# Patient Record
Sex: Male | Born: 1945
Health system: Southern US, Community
[De-identification: ages and names within clinical notes are randomized; demographics above are authoritative.]

## PROBLEM LIST (undated history)

## (undated) DIAGNOSIS — W3400XA Accidental discharge from unspecified firearms or gun, initial encounter: Secondary | ICD-10-CM

## (undated) DIAGNOSIS — I10 Essential (primary) hypertension: Secondary | ICD-10-CM

## (undated) DIAGNOSIS — E669 Obesity, unspecified: Secondary | ICD-10-CM

## (undated) DIAGNOSIS — H269 Unspecified cataract: Secondary | ICD-10-CM

## (undated) DIAGNOSIS — R011 Cardiac murmur, unspecified: Secondary | ICD-10-CM

## (undated) DIAGNOSIS — I350 Nonrheumatic aortic (valve) stenosis: Secondary | ICD-10-CM

## (undated) DIAGNOSIS — E119 Type 2 diabetes mellitus without complications: Secondary | ICD-10-CM

## (undated) DIAGNOSIS — E7800 Pure hypercholesterolemia, unspecified: Secondary | ICD-10-CM

## (undated) HISTORY — DX: Nonrheumatic aortic (valve) stenosis: I35.0

## (undated) HISTORY — DX: Accidental discharge from unspecified firearms or gun, initial encounter: W34.00XA

## (undated) HISTORY — DX: Type 2 diabetes mellitus without complications: E11.9

## (undated) HISTORY — PX: OTHER SURGICAL HISTORY: SHX169

## (undated) HISTORY — DX: Unspecified cataract: H26.9

## (undated) HISTORY — PX: WISDOM TOOTH EXTRACTION: SHX21

---

## 1968-07-29 DIAGNOSIS — W3400XA Accidental discharge from unspecified firearms or gun, initial encounter: Secondary | ICD-10-CM

## 1968-07-29 HISTORY — DX: Accidental discharge from unspecified firearms or gun, initial encounter: W34.00XA

## 2011-11-14 DIAGNOSIS — I350 Nonrheumatic aortic (valve) stenosis: Secondary | ICD-10-CM

## 2011-11-14 HISTORY — DX: Nonrheumatic aortic (valve) stenosis: I35.0

## 2012-10-28 ENCOUNTER — Emergency Department (HOSPITAL_COMMUNITY)
Admission: EM | Admit: 2012-10-28 | Discharge: 2012-10-28 | Disposition: A | Discharge status: Home / Self Care | Payer: Medicare Other | Attending: Emergency Medicine | Admitting: Emergency Medicine

## 2012-10-28 ENCOUNTER — Encounter (HOSPITAL_COMMUNITY): Payer: Self-pay | Admitting: Emergency Medicine

## 2012-10-28 DIAGNOSIS — I10 Essential (primary) hypertension: Secondary | ICD-10-CM | POA: Insufficient documentation

## 2012-10-28 DIAGNOSIS — J3489 Other specified disorders of nose and nasal sinuses: Secondary | ICD-10-CM | POA: Insufficient documentation

## 2012-10-28 DIAGNOSIS — Z79899 Other long term (current) drug therapy: Secondary | ICD-10-CM | POA: Insufficient documentation

## 2012-10-28 DIAGNOSIS — Z7982 Long term (current) use of aspirin: Secondary | ICD-10-CM | POA: Insufficient documentation

## 2012-10-28 DIAGNOSIS — R040 Epistaxis: Secondary | ICD-10-CM | POA: Insufficient documentation

## 2012-10-28 DIAGNOSIS — R6889 Other general symptoms and signs: Secondary | ICD-10-CM | POA: Insufficient documentation

## 2012-10-28 DIAGNOSIS — Z862 Personal history of diseases of the blood and blood-forming organs and certain disorders involving the immune mechanism: Secondary | ICD-10-CM | POA: Insufficient documentation

## 2012-10-28 DIAGNOSIS — R011 Cardiac murmur, unspecified: Secondary | ICD-10-CM | POA: Insufficient documentation

## 2012-10-28 DIAGNOSIS — Z8639 Personal history of other endocrine, nutritional and metabolic disease: Secondary | ICD-10-CM | POA: Insufficient documentation

## 2012-10-28 HISTORY — DX: Pure hypercholesterolemia, unspecified: E78.00

## 2012-10-28 HISTORY — DX: Cardiac murmur, unspecified: R01.1

## 2012-10-28 HISTORY — DX: Essential (primary) hypertension: I10

## 2012-10-28 NOTE — ED Provider Notes (Signed)
History     CSN: [id number]  Arrival date & time 10/28/12  1932   None     Chief Complaint  Patient presents with  . Epistaxis    (Consider location/radiation/quality/duration/timing/severity/associated sxs/prior treatment) HPI History provided by pt.   Pt had bleeding from right nostril this afternoon while working out in garden, and lasted for approximately 20 minutes.  Bleed heavily.  Stopped after holding pressure.  Has had nasal congestion, sinus pressure and sneezing today, but denies nose pain and is otherwise feeling well. Has not had lightheadedness, SOB, fatigue or generalized weakness.   No trauma.  Is not anti-coagulated.   Past Medical History  Diagnosis Date  . Hypertension   . Heart murmur   . Hypercholesteremia     History reviewed. No pertinent past surgical history.  No family history on file.  History  Substance Use Topics  . Smoking status: Never Smoker   . Smokeless tobacco: Not on file  . Alcohol Use: No      Review of Systems  All other systems reviewed and are negative.    Allergies  Review of patient's allergies indicates no known allergies.  Home Medications   Current Outpatient Rx  Name  Route  Sig  Dispense  Refill  . amLODipine (NORVASC) 10 MG tablet   Oral   Take 10 mg by mouth daily.         Marland Kitchen aspirin 81 MG chewable tablet   Oral   Chew 81 mg by mouth daily.         . metoprolol tartrate (LOPRESSOR) 25 MG tablet   Oral   Take 25 mg by mouth 2 (two) times daily.         . Multiple Vitamin (MULTIVITAMIN WITH MINERALS) TABS   Oral   Take 1 tablet by mouth daily.         . potassium chloride (K-DUR,KLOR-CON) 10 MEQ tablet   Oral   Take 10 mEq by mouth daily.         Marland Kitchen triamterene-hydrochlorothiazide (MAXZIDE) 75-50 MG per tablet   Oral   Take 1 tablet by mouth daily.           BP 147/106  Pulse 64  Temp(Src) 98.1 F (36.7 C) (Oral)  Resp 14  SpO2 97%  Physical Exam  Nursing note and vitals  reviewed. Constitutional: He is oriented to person, place, and time. He appears well-developed and well-nourished. No distress.  HENT:  Head: Normocephalic and atraumatic.  Dried blood at kiesselbach's plexus of right nostril.  No active epistaxis.  L nostril normal.  No sinus tenderness.  Nml posterior pharynx.   Eyes:  Normal appearance  Neck: Normal range of motion.  Cardiovascular: Normal rate and regular rhythm.   Murmur heard. Pulmonary/Chest: Effort normal and breath sounds normal. No respiratory distress.  Musculoskeletal: Normal range of motion.  Neurological: He is alert and oriented to person, place, and time.  Skin: Skin is warm and dry. No rash noted.  Psychiatric: He has a normal mood and affect. His behavior is normal.    ED Course  Procedures (including critical care time)  Labs Reviewed - No data to display No results found.   1. Epistaxis       67yo M presents w/ c/o epistaxis, currently resolved.  No trauma.  Is not anti-coagulated.  It appears that bleeding originated at R kiesselbach's plexus.  VS w/in nml range and pt has not had any symptoms to suggest anemia.  Explained how to treat future episodes and recommended PCP f/u for recurrent sx.  Return precautions discussed.         Arie Sabina Shashwat Cleary, PA-C 10/28/12 2128

## 2012-10-28 NOTE — ED Notes (Signed)
PT. REPORTS EPISTAXIS THIS EVENING , DENIES INJURY , NO BLEEDING AT TRIAGE , STATES TAKING ASA DAILY.

## 2012-10-29 NOTE — ED Provider Notes (Signed)
Medical screening examination/treatment/procedure(s) were performed by non-physician practitioner and as supervising physician I was immediately available for consultation/collaboration.  Tyrone Pautsch T Arlis Yale, MD 10/29/12 1146 

## 2012-11-03 ENCOUNTER — Encounter: Payer: Self-pay | Admitting: *Deleted

## 2012-11-04 ENCOUNTER — Encounter: Payer: Self-pay | Admitting: Cardiovascular Disease

## 2013-01-11 ENCOUNTER — Emergency Department (HOSPITAL_COMMUNITY): Payer: Medicare Other

## 2013-01-11 ENCOUNTER — Encounter (HOSPITAL_COMMUNITY): Payer: Self-pay | Admitting: Emergency Medicine

## 2013-01-11 ENCOUNTER — Emergency Department (HOSPITAL_COMMUNITY)
Admission: EM | Admit: 2013-01-11 | Discharge: 2013-01-11 | Disposition: A | Discharge status: Home / Self Care | Payer: Medicare Other | Attending: Emergency Medicine | Admitting: Emergency Medicine

## 2013-01-11 DIAGNOSIS — M79604 Pain in right leg: Secondary | ICD-10-CM

## 2013-01-11 DIAGNOSIS — Z79899 Other long term (current) drug therapy: Secondary | ICD-10-CM | POA: Insufficient documentation

## 2013-01-11 DIAGNOSIS — Z8679 Personal history of other diseases of the circulatory system: Secondary | ICD-10-CM | POA: Insufficient documentation

## 2013-01-11 DIAGNOSIS — M5430 Sciatica, unspecified side: Secondary | ICD-10-CM | POA: Insufficient documentation

## 2013-01-11 DIAGNOSIS — Z7982 Long term (current) use of aspirin: Secondary | ICD-10-CM | POA: Insufficient documentation

## 2013-01-11 DIAGNOSIS — E7800 Pure hypercholesterolemia, unspecified: Secondary | ICD-10-CM | POA: Insufficient documentation

## 2013-01-11 DIAGNOSIS — I10 Essential (primary) hypertension: Secondary | ICD-10-CM | POA: Insufficient documentation

## 2013-01-11 DIAGNOSIS — M79609 Pain in unspecified limb: Secondary | ICD-10-CM | POA: Insufficient documentation

## 2013-01-11 DIAGNOSIS — Z880 Allergy status to penicillin: Secondary | ICD-10-CM | POA: Insufficient documentation

## 2013-01-11 DIAGNOSIS — Z87828 Personal history of other (healed) physical injury and trauma: Secondary | ICD-10-CM | POA: Insufficient documentation

## 2013-01-11 DIAGNOSIS — Z87891 Personal history of nicotine dependence: Secondary | ICD-10-CM | POA: Insufficient documentation

## 2013-01-11 DIAGNOSIS — M5431 Sciatica, right side: Secondary | ICD-10-CM

## 2013-01-11 MED ORDER — PREDNISONE 20 MG PO TABS: 40.0000 mg | ORAL_TABLET | Freq: Every day | ORAL | Status: DC

## 2013-01-11 MED ORDER — HYDROCODONE-ACETAMINOPHEN 5-325 MG PO TABS: 1.0000 | ORAL_TABLET | ORAL | Status: DC | PRN
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
## 2013-01-14 NOTE — ED Provider Notes (Signed)
Medical screening examination/treatment/procedure(s) were performed by non-physician practitioner and as supervising physician I was immediately available for consultation/collaboration.   Mitch Arquette, MD 01/14/13 0711 

## 2013-02-04 ENCOUNTER — Encounter: Payer: Self-pay | Admitting: Gastroenterology

## 2013-02-18 ENCOUNTER — Encounter: Payer: Self-pay | Admitting: Gastroenterology

## 2013-02-18 ENCOUNTER — Ambulatory Visit (INDEPENDENT_AMBULATORY_CARE_PROVIDER_SITE_OTHER): Payer: Medicare Other | Admitting: Gastroenterology

## 2013-02-18 VITALS — BP 140/70 | HR 64 | Ht 66.75 in | Wt 249.2 lb

## 2013-02-18 DIAGNOSIS — D509 Iron deficiency anemia, unspecified: Secondary | ICD-10-CM

## 2013-02-18 DIAGNOSIS — R195 Other fecal abnormalities: Secondary | ICD-10-CM | POA: Insufficient documentation

## 2013-02-18 NOTE — Assessment & Plan Note (Addendum)
Patient has a microcytic anemia and Hemoccult-positive stool. GI blood loss should be ruled out including polyps, AVMs, neoplasm, hemorrhoids and active peptic ulcer disease. Note that the patient has a history of Pica. This could be contributing to his iron deficiency anemia although I would not expect it to cause a Hemoccult-positive stool.  Recommendations #1 colonoscopy; if negative proceed with upper endoscopy
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
## 2013-02-18 NOTE — Patient Instructions (Addendum)

## 2013-02-18 NOTE — Assessment & Plan Note (Signed)
See comments under iron deficiency anemia

## 2013-03-19 ENCOUNTER — Other Ambulatory Visit: Payer: Self-pay | Admitting: Cardiovascular Disease

## 2013-03-31 ENCOUNTER — Encounter: Payer: Self-pay | Admitting: Gastroenterology

## 2013-03-31 ENCOUNTER — Ambulatory Visit (AMBULATORY_SURGERY_CENTER): Payer: Medicare Other | Admitting: Gastroenterology

## 2013-03-31 VITALS — BP 113/66 | HR 50 | Temp 97.4°F | Resp 18 | Ht 66.0 in | Wt 249.0 lb

## 2013-03-31 DIAGNOSIS — D126 Benign neoplasm of colon, unspecified: Secondary | ICD-10-CM

## 2013-03-31 DIAGNOSIS — K621 Rectal polyp: Secondary | ICD-10-CM

## 2013-03-31 DIAGNOSIS — K620 Anal polyp: Secondary | ICD-10-CM

## 2013-03-31 DIAGNOSIS — D509 Iron deficiency anemia, unspecified: Secondary | ICD-10-CM

## 2013-03-31 LAB — GLUCOSE, CAPILLARY
Glucose-Capillary: 101 mg/dL — ABNORMAL HIGH (ref 70–99)
Glucose-Capillary: 114 mg/dL — ABNORMAL HIGH (ref 70–99)

## 2013-03-31 MED ORDER — SODIUM CHLORIDE 0.9 % IV SOLN: 500.0000 mL | INTRAVENOUS | Status: DC

## 2013-03-31 NOTE — Progress Notes (Signed)
Called to room to assist during endoscopic procedure.  Patient ID and intended procedure confirmed with present staff. Received instructions for my participation in the procedure from the performing physician.  

## 2013-03-31 NOTE — Progress Notes (Signed)
Report to pacu rn, vss, bbs=clear 

## 2013-03-31 NOTE — Op Note (Addendum)
[facility] [address] [place], [address]   COLONOSCOPY PROCEDURE REPORT  PATIENT: Bernard Garcia, Bernard Garcia.  MR#: [id number] BIRTHDATE: 26-Sep-1945 , 66  yrs. old GENDER: Male ENDOSCOPIST: Louis Meckel, MD REFERRED BY: PROCEDURE DATE:  03/31/2013 PROCEDURE:   Colonoscopy with snare polypectomy  ASA CLASS:   Class II INDICATIONS:Iron Deficiency Anemia. MEDICATIONS: MAC sedation, administered by CRNA and Propofol (Diprivan) 340 mg IV  DESCRIPTION OF PROCEDURE:   After the risks benefits and alternatives of the procedure were thoroughly explained, informed consent was obtained.  A digital rectal exam revealed no abnormalities of the rectum.   The LB [id number]  endoscope was introduced through the anus and advanced to the cecum, which was identified by both the appendix and ileocecal valve. No adverse events experienced.   The quality of the prep was excellent using Suprep  The instrument was then slowly withdrawn as the colon was fully examined.      COLON FINDINGS: A sessile polyp measuring 4 mm in size was found in the rectum.  A polypectomy was performed with a cold snare and with cold forceps.  The resection was complete and the polyp tissue was completely retrieved.  Retroflexed views revealed no abnormalities. The time to cecum=3 minutes 26 seconds.  Withdrawal time=11 minutes 09 seconds.  The scope was withdrawn and the procedure completed. COMPLICATIONS: There were no complications.  ENDOSCOPIC IMPRESSION: Sessile polyp measuring 4 mm in size was found in the rectum; polypectomy was performed with a cold snare and with cold forceps  Source for Fe deficiency anemia and heme positive stool not determined  RECOMMENDATIONS: 1.  Upper endoscopy will be scheduled 2.  If the polyp(s) removed today are proven to be adenomatous (pre-cancerous) polyps, you will need a repeat colonoscopy in 5 years.  Otherwise you should continue to follow  colorectal cancer screening guidelines for "routine risk" patients with colonoscopy in 10 years.  You will receive a letter within 1-2 weeks with the results of your biopsy as well as final recommendations.  Please call my office if you have not received a letter after 3 weeks.   eSigned:  Louis Meckel, MD 03/31/2013 2:17 PM   cc:   PATIENT NAME:  Bernard Garcia, Bernard Garcia. MR#: [id number]

## 2013-03-31 NOTE — Patient Instructions (Addendum)

## 2013-03-31 NOTE — Progress Notes (Signed)
Patient did not experience any of the following events: a burn prior to discharge; a fall within the facility; wrong site/side/patient/procedure/implant event; or a hospital transfer or hospital admission upon discharge from the facility. (G8907) Patient did not have preoperative order for IV antibiotic SSI prophylaxis. (G8918)  

## 2013-04-01 ENCOUNTER — Telehealth: Payer: Self-pay | Admitting: *Deleted

## 2013-04-01 NOTE — Telephone Encounter (Signed)
No answer, message left for the patient. 

## 2013-04-05 ENCOUNTER — Other Ambulatory Visit: Payer: Self-pay | Admitting: Cardiovascular Disease

## 2013-04-05 LAB — LIPID PANEL
Cholesterol: 95 mg/dL (ref 0–200)
HDL: 32 mg/dL — ABNORMAL LOW (ref >39)
Total CHOL/HDL Ratio: 3 Ratio

## 2013-04-05 LAB — HEPATIC FUNCTION PANEL
ALT: 11 U/L (ref 0–53)
AST: 13 U/L (ref 0–37)
Albumin: 4.3 g/dL (ref 3.5–5.2)
Alkaline Phosphatase: 83 U/L (ref 39–117)
Bilirubin, Direct: 0.1 mg/dL (ref 0.0–0.3)
Total Bilirubin: 0.4 mg/dL (ref 0.3–1.2)

## 2013-04-07 ENCOUNTER — Encounter: Payer: Self-pay | Admitting: *Deleted

## 2013-04-07 ENCOUNTER — Encounter: Payer: Self-pay | Admitting: Gastroenterology

## 2013-04-08 ENCOUNTER — Encounter: Payer: Self-pay | Admitting: Gastroenterology

## 2013-04-09 ENCOUNTER — Telehealth: Payer: Self-pay | Admitting: *Deleted

## 2013-04-09 NOTE — Telephone Encounter (Signed)
No to pre-visit appointment on 04/09/13 at 1pm, number has been disconnected.

## 2013-04-14 NOTE — Telephone Encounter (Signed)
Pt had pre-visit appt on 04/09/13, called pt left message pt would need to call and reschedule appt with pre-visit and schedule new date for procedure, both would be cancelled at this time.-adm

## 2013-04-15 ENCOUNTER — Encounter: Payer: Medicare Other | Admitting: Gastroenterology

## 2013-09-08 ENCOUNTER — Emergency Department (HOSPITAL_COMMUNITY)
Admission: EM | Admit: 2013-09-08 | Discharge: 2013-09-08 | Disposition: A | Discharge status: Home / Self Care | Payer: Medicare PPO | Attending: Emergency Medicine | Admitting: Emergency Medicine

## 2013-09-08 ENCOUNTER — Encounter (HOSPITAL_COMMUNITY): Payer: Self-pay | Admitting: Emergency Medicine

## 2013-09-08 DIAGNOSIS — D509 Iron deficiency anemia, unspecified: Secondary | ICD-10-CM | POA: Insufficient documentation

## 2013-09-08 DIAGNOSIS — R011 Cardiac murmur, unspecified: Secondary | ICD-10-CM | POA: Insufficient documentation

## 2013-09-08 DIAGNOSIS — R040 Epistaxis: Secondary | ICD-10-CM

## 2013-09-08 DIAGNOSIS — E669 Obesity, unspecified: Secondary | ICD-10-CM | POA: Insufficient documentation

## 2013-09-08 DIAGNOSIS — I10 Essential (primary) hypertension: Secondary | ICD-10-CM | POA: Insufficient documentation

## 2013-09-08 DIAGNOSIS — Z880 Allergy status to penicillin: Secondary | ICD-10-CM | POA: Insufficient documentation

## 2013-09-08 DIAGNOSIS — E7800 Pure hypercholesterolemia, unspecified: Secondary | ICD-10-CM | POA: Insufficient documentation

## 2013-09-08 DIAGNOSIS — Z87828 Personal history of other (healed) physical injury and trauma: Secondary | ICD-10-CM | POA: Insufficient documentation

## 2013-09-08 DIAGNOSIS — Z7982 Long term (current) use of aspirin: Secondary | ICD-10-CM | POA: Insufficient documentation

## 2013-09-08 DIAGNOSIS — E119 Type 2 diabetes mellitus without complications: Secondary | ICD-10-CM | POA: Insufficient documentation

## 2013-09-08 DIAGNOSIS — Z79899 Other long term (current) drug therapy: Secondary | ICD-10-CM | POA: Insufficient documentation

## 2013-09-08 DIAGNOSIS — E785 Hyperlipidemia, unspecified: Secondary | ICD-10-CM | POA: Insufficient documentation

## 2013-09-08 DIAGNOSIS — Z87891 Personal history of nicotine dependence: Secondary | ICD-10-CM | POA: Insufficient documentation

## 2013-09-08 HISTORY — DX: Obesity, unspecified: E66.9

## 2013-09-08 LAB — CBC
HCT: 32 % — ABNORMAL LOW (ref 39.0–52.0)
HEMOGLOBIN: 9.8 g/dL — AB (ref 13.0–17.0)
MCH: 21.9 pg — AB (ref 26.0–34.0)
MCHC: 30.6 g/dL (ref 30.0–36.0)
MCV: 71.6 fL — ABNORMAL LOW (ref 78.0–100.0)
Platelets: 321 10*3/uL (ref 150–400)
RBC: 4.47 MIL/uL (ref 4.22–5.81)
RDW: 17.2 % — ABNORMAL HIGH (ref 11.5–15.5)
WBC: 8.3 10*3/uL (ref 4.0–10.5)

## 2013-09-08 MED ORDER — OXYMETAZOLINE HCL 0.05 % NA SOLN: NASAL | Status: DC

## 2013-09-08 NOTE — ED Notes (Signed)
Pt reports bleeding to right nare that lasted less than an hour. Denies any dizziness/lightheadedness. Pt ambulates independently. Bleeding stopped. Nare with dried blood. Denies pain.

## 2013-09-08 NOTE — Discharge Instructions (Signed)
1. Medications: afrin, usual home medications 2. Treatment: rest, drink plenty of fluids,  3. Follow Up: Please followup with your primary doctor for discussion of your diagnoses and further evaluation after today's visit;     Nosebleed Nosebleeds can be caused by many conditions including trauma, infections, polyps, foreign bodies, dry mucous membranes or climate, medications and air conditioning. Most nosebleeds occur in the front of the nose. It is because of this location that most nosebleeds can be controlled by pinching the nostrils gently and continuously. Do this for at least 10 to 20 minutes. The reason for this long continuous pressure is that you must hold it long enough for the blood to clot. If during that 10 to 20 minute time period, pressure is released, the process may have to be started again. The nosebleed may stop by itself, quit with pressure, need concentrated heating (cautery) or stop with pressure from packing. HOME CARE INSTRUCTIONS   If your nose was packed, try to maintain the pack inside until your caregiver removes it. If a gauze pack was used and it starts to fall out, gently replace or cut the end off. Do not cut if a balloon catheter was used to pack the nose. Otherwise, do not remove unless instructed.  Avoid blowing your nose for 12 hours after treatment. This could dislodge the pack or clot and start bleeding again.  If the bleeding starts again, sit up and bending forward, gently pinch the front half of your nose continuously for 20 minutes.  If bleeding was caused by dry mucous membranes, cover the inside of your nose every morning with a petroleum or antibiotic ointment. Use your little fingertip as an applicator. Do this as needed during dry weather. This will keep the mucous membranes moist and allow them to heal.  Maintain humidity in your home by using less air conditioning or using a humidifier.  Do not use aspirin or medications which make bleeding more  likely. Your caregiver can give you recommendations on this.  Resume normal activities as able but try to avoid straining, lifting or bending at the waist for several days.  If the nosebleeds become recurrent and the cause is unknown, your caregiver may suggest laboratory tests. SEEK IMMEDIATE MEDICAL CARE IF:   Bleeding recurs and cannot be controlled.  There is unusual bleeding from or bruising on other parts of the body.  You have a fever.  Nosebleeds continue.  There is any worsening of the condition which originally brought you in.  You become lightheaded, feel faint, become sweaty or vomit blood. MAKE SURE YOU:   Understand these instructions.  Will watch your condition.  Will get help right away if you are not doing well or get worse. Document Released: 04/24/2005 Document Revised: 10/07/2011 Document Reviewed: 06/16/2009 [facility] Patient Information 2014 [place], [place].
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
## 2013-09-08 NOTE — ED Notes (Signed)
Pt. reports epistaxis at right nare onset this evening , denies injury , pt. is not taking anticoagulant or history of coagulopathy.

## 2013-09-09 NOTE — ED Provider Notes (Signed)
Medical screening examination/treatment/procedure(s) were performed by non-physician practitioner and as supervising physician I was immediately available for consultation/collaboration.  EKG Interpretation   None         Mervin Kung, MD 09/09/13 [phone]

## 2013-09-10 ENCOUNTER — Other Ambulatory Visit: Payer: Self-pay | Admitting: Cardiovascular Disease

## 2013-10-27 ENCOUNTER — Ambulatory Visit: Payer: Medicare Other | Admitting: Cardiovascular Disease

## 2013-11-30 ENCOUNTER — Ambulatory Visit (INDEPENDENT_AMBULATORY_CARE_PROVIDER_SITE_OTHER): Payer: Commercial Managed Care - HMO | Admitting: Cardiovascular Disease

## 2013-11-30 VITALS — BP 162/72 | HR 48 | Resp 16 | Ht 67.0 in | Wt 250.7 lb

## 2013-11-30 DIAGNOSIS — E7800 Pure hypercholesterolemia, unspecified: Secondary | ICD-10-CM

## 2013-11-30 DIAGNOSIS — I350 Nonrheumatic aortic (valve) stenosis: Secondary | ICD-10-CM

## 2013-11-30 DIAGNOSIS — I359 Nonrheumatic aortic valve disorder, unspecified: Secondary | ICD-10-CM

## 2013-11-30 DIAGNOSIS — R002 Palpitations: Secondary | ICD-10-CM

## 2013-11-30 DIAGNOSIS — I10 Essential (primary) hypertension: Secondary | ICD-10-CM

## 2013-11-30 MED ORDER — CARVEDILOL 6.25 MG PO TABS: 6.2500 mg | ORAL_TABLET | Freq: Two times a day (BID) | ORAL | Status: DC

## 2013-11-30 NOTE — Patient Instructions (Signed)
Your physician recommends that you schedule a follow-up appointment in: One Year with Dr. Sallyanne Kuster  STOP Metoprolol START Coreg 6.25mg . TWICE A DAY

## 2013-12-01 ENCOUNTER — Encounter: Payer: Self-pay | Admitting: Cardiovascular Disease

## 2013-12-01 DIAGNOSIS — I350 Nonrheumatic aortic (valve) stenosis: Secondary | ICD-10-CM | POA: Insufficient documentation

## 2013-12-01 DIAGNOSIS — I10 Essential (primary) hypertension: Secondary | ICD-10-CM | POA: Insufficient documentation

## 2013-12-01 DIAGNOSIS — E7800 Pure hypercholesterolemia, unspecified: Secondary | ICD-10-CM | POA: Insufficient documentation

## 2013-12-01 NOTE — Assessment & Plan Note (Signed)
Excellent reduction in LDL cholesterol. HDL would improve if he would lose substantial weight.

## 2013-12-01 NOTE — Assessment & Plan Note (Signed)
Marked and symptomatic sinus bradycardia.  I think we need to reduce the beta blocker effect. Have suggested that we switch from metoprolol to carvedilol which should provide for a better reduction in blood pressure without as much bradycardia.

## 2013-12-01 NOTE — Assessment & Plan Note (Signed)
Asymptomatic. No signs of critical stenosis by physical exam. Repeat echo if he develops symptoms.
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
## 2013-12-17 ENCOUNTER — Telehealth: Payer: Self-pay | Admitting: Cardiovascular Disease

## 2013-12-17 ENCOUNTER — Telehealth: Payer: Self-pay | Admitting: *Deleted

## 2013-12-17 MED ORDER — CARVEDILOL 6.25 MG PO TABS: 3.1250 mg | ORAL_TABLET | Freq: Two times a day (BID) | ORAL | Status: DC

## 2013-12-17 NOTE — Telephone Encounter (Signed)
Left message on home and cell #'s with instructions to decrease carvedilol to 3.125mg  bid keep taking BP's and keep a log and send to [place] after making med changes.

## 2013-12-17 NOTE — Telephone Encounter (Signed)
Received his blood pressure and heart rate recordings. His heart rate is still rather slow. Reduce carvedilol to 3.125 mg twice a day. Please send [place] another week of recordings starting several days after he has made a change.

## 2013-12-17 NOTE — Telephone Encounter (Signed)
Message copied by Tressa Busman on Fri Dec 17, 2013  5:38 PM ------      Message from: Sanda Klein      Created: Fri Dec 17, 2013  2:44 PM       Please ask them to reduce carvedilol to 3.125 mg twice a day and send [place] another week of blood pressure recordings several days after he has made a change. I made a note in the computer but not changed the medicine order. ------

## 2013-12-21 ENCOUNTER — Telehealth: Payer: Self-pay | Admitting: *Deleted

## 2013-12-21 NOTE — Telephone Encounter (Signed)
Patient did receive the message last week and reduced his Carvedilol to 3.125mg  bid. States he was having some lightheadedness on the higher dose and this has resolved.  Will continue to monitor his BP's and send to Dr. Loletha Grayer after one week

## 2013-12-21 NOTE — Telephone Encounter (Signed)
Message copied by Tressa Busman on Tue Dec 21, 2013  5:26 PM ------      Message from: Sanda Klein      Created: Fri Dec 17, 2013  2:44 PM       Please ask them to reduce carvedilol to 3.125 mg twice a day and send [place] another week of blood pressure recordings several days after he has made a change. I made a note in the computer but not changed the medicine order. ------

## 2013-12-22 ENCOUNTER — Other Ambulatory Visit: Payer: Self-pay | Admitting: *Deleted

## 2014-09-08 DIAGNOSIS — E785 Hyperlipidemia, unspecified: Secondary | ICD-10-CM | POA: Diagnosis not present

## 2014-09-08 DIAGNOSIS — E669 Obesity, unspecified: Secondary | ICD-10-CM | POA: Diagnosis not present

## 2014-09-08 DIAGNOSIS — Z6841 Body Mass Index (BMI) 40.0 and over, adult: Secondary | ICD-10-CM | POA: Diagnosis not present

## 2014-09-08 DIAGNOSIS — I10 Essential (primary) hypertension: Secondary | ICD-10-CM | POA: Diagnosis not present

## 2014-10-18 DIAGNOSIS — R972 Elevated prostate specific antigen [PSA]: Secondary | ICD-10-CM | POA: Diagnosis not present

## 2014-12-02 ENCOUNTER — Ambulatory Visit (INDEPENDENT_AMBULATORY_CARE_PROVIDER_SITE_OTHER): Payer: Commercial Managed Care - HMO | Admitting: Cardiovascular Disease

## 2014-12-02 ENCOUNTER — Encounter: Payer: Self-pay | Admitting: Cardiovascular Disease

## 2014-12-02 VITALS — BP 140/69 | HR 64 | Resp 16 | Ht 67.0 in | Wt 257.4 lb

## 2014-12-02 DIAGNOSIS — I10 Essential (primary) hypertension: Secondary | ICD-10-CM

## 2014-12-02 DIAGNOSIS — E7800 Pure hypercholesterolemia, unspecified: Secondary | ICD-10-CM

## 2014-12-02 DIAGNOSIS — I350 Nonrheumatic aortic (valve) stenosis: Secondary | ICD-10-CM

## 2014-12-02 NOTE — Patient Instructions (Signed)
Dr.Croitoru  wants you to follow-up in: ONE YEAR. You will receive a reminder letter in the mail two months in advance. If you don't receive a letter, please call our office to schedule the follow-up appointment.
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
## 2014-12-08 ENCOUNTER — Encounter: Payer: Self-pay | Admitting: Cardiovascular Disease

## 2014-12-08 DIAGNOSIS — I10 Essential (primary) hypertension: Secondary | ICD-10-CM | POA: Diagnosis not present

## 2014-12-08 DIAGNOSIS — Z6841 Body Mass Index (BMI) 40.0 and over, adult: Secondary | ICD-10-CM | POA: Diagnosis not present

## 2014-12-08 DIAGNOSIS — E669 Obesity, unspecified: Secondary | ICD-10-CM | POA: Diagnosis not present

## 2015-04-03 ENCOUNTER — Other Ambulatory Visit: Payer: Self-pay | Admitting: Cardiovascular Disease

## 2015-05-26 DIAGNOSIS — I10 Essential (primary) hypertension: Secondary | ICD-10-CM | POA: Diagnosis not present

## 2015-05-26 DIAGNOSIS — Z125 Encounter for screening for malignant neoplasm of prostate: Secondary | ICD-10-CM | POA: Diagnosis not present

## 2015-05-26 DIAGNOSIS — E785 Hyperlipidemia, unspecified: Secondary | ICD-10-CM | POA: Diagnosis not present

## 2015-05-26 DIAGNOSIS — R739 Hyperglycemia, unspecified: Secondary | ICD-10-CM | POA: Diagnosis not present

## 2015-06-02 DIAGNOSIS — R0989 Other specified symptoms and signs involving the circulatory and respiratory systems: Secondary | ICD-10-CM | POA: Diagnosis not present

## 2015-06-02 DIAGNOSIS — I10 Essential (primary) hypertension: Secondary | ICD-10-CM | POA: Diagnosis not present

## 2015-06-02 DIAGNOSIS — Z1212 Encounter for screening for malignant neoplasm of rectum: Secondary | ICD-10-CM | POA: Diagnosis not present

## 2015-06-02 DIAGNOSIS — R972 Elevated prostate specific antigen [PSA]: Secondary | ICD-10-CM | POA: Diagnosis not present

## 2015-06-02 DIAGNOSIS — Z0000 Encounter for general adult medical examination without abnormal findings: Secondary | ICD-10-CM | POA: Diagnosis not present

## 2015-06-02 DIAGNOSIS — I350 Nonrheumatic aortic (valve) stenosis: Secondary | ICD-10-CM | POA: Diagnosis not present

## 2015-06-02 DIAGNOSIS — E784 Other hyperlipidemia: Secondary | ICD-10-CM | POA: Diagnosis not present

## 2015-06-02 DIAGNOSIS — R739 Hyperglycemia, unspecified: Secondary | ICD-10-CM | POA: Diagnosis not present

## 2015-06-02 DIAGNOSIS — D649 Anemia, unspecified: Secondary | ICD-10-CM | POA: Diagnosis not present

## 2015-06-02 LAB — IFOBT (OCCULT BLOOD): IMMUNOLOGICAL FECAL OCCULT BLOOD TEST: NEGATIVE

## 2015-06-05 ENCOUNTER — Encounter: Payer: Self-pay | Admitting: Gastroenterology

## 2015-08-08 ENCOUNTER — Ambulatory Visit: Payer: Commercial Managed Care - HMO | Admitting: Gastroenterology

## 2015-10-31 ENCOUNTER — Encounter (HOSPITAL_COMMUNITY): Payer: Self-pay

## 2015-10-31 DIAGNOSIS — R011 Cardiac murmur, unspecified: Secondary | ICD-10-CM | POA: Insufficient documentation

## 2015-10-31 DIAGNOSIS — Z880 Allergy status to penicillin: Secondary | ICD-10-CM | POA: Diagnosis not present

## 2015-10-31 DIAGNOSIS — I10 Essential (primary) hypertension: Secondary | ICD-10-CM | POA: Insufficient documentation

## 2015-10-31 DIAGNOSIS — E119 Type 2 diabetes mellitus without complications: Secondary | ICD-10-CM | POA: Insufficient documentation

## 2015-10-31 DIAGNOSIS — E669 Obesity, unspecified: Secondary | ICD-10-CM | POA: Insufficient documentation

## 2015-10-31 DIAGNOSIS — Z79899 Other long term (current) drug therapy: Secondary | ICD-10-CM | POA: Diagnosis not present

## 2015-10-31 DIAGNOSIS — E7800 Pure hypercholesterolemia, unspecified: Secondary | ICD-10-CM | POA: Insufficient documentation

## 2015-10-31 DIAGNOSIS — R040 Epistaxis: Secondary | ICD-10-CM | POA: Insufficient documentation

## 2015-10-31 DIAGNOSIS — Z87828 Personal history of other (healed) physical injury and trauma: Secondary | ICD-10-CM | POA: Insufficient documentation

## 2015-10-31 DIAGNOSIS — Z87891 Personal history of nicotine dependence: Secondary | ICD-10-CM | POA: Diagnosis not present

## 2015-10-31 DIAGNOSIS — Z7982 Long term (current) use of aspirin: Secondary | ICD-10-CM | POA: Insufficient documentation

## 2015-10-31 NOTE — ED Notes (Signed)
Onset 8:30p nosebleed.  Pt reports nosebleeds x 2 weeks.  No headache, cold/cough symptoms.  This nurse instructed pt how to hold pressure on nose.

## 2015-11-01 ENCOUNTER — Emergency Department (HOSPITAL_COMMUNITY)
Admission: EM | Admit: 2015-11-01 | Discharge: 2015-11-01 | Disposition: A | Discharge status: Home / Self Care | Payer: Commercial Managed Care - HMO | Attending: Emergency Medicine | Admitting: Emergency Medicine

## 2015-11-01 ENCOUNTER — Encounter (HOSPITAL_COMMUNITY): Payer: Self-pay | Admitting: Emergency Medicine

## 2015-11-01 DIAGNOSIS — R040 Epistaxis: Secondary | ICD-10-CM
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
## 2015-11-01 NOTE — Discharge Instructions (Signed)
Recommend packing removal in 5-7 days.   Nosebleed Nosebleeds are common. They are due to a crack in the inside lining of your nose (mucous membrane) or from a small blood vessel that starts to bleed. Nosebleeds can be caused by many conditions, such as injury, infections, dry mucous membranes or dry climate, medicines, nose picking, and home heating and cooling systems. Most nosebleeds come from blood vessels in the front of your nose. HOME CARE INSTRUCTIONS   Try controlling your nosebleed by pinching your nostrils gently and continuously for at least 10 minutes.  Avoid blowing or sniffing your nose for a number of hours after having a nosebleed.  Do not put gauze inside your nose yourself. If your nose was packed by your health care provider, try to maintain the pack inside of your nose until your health care provider removes it.  If a gauze pack was used and it starts to fall out, gently replace it or cut off the end of it.  If a balloon catheter was used to pack your nose, do not cut or remove it unless your health care provider has instructed you to do that.  Avoid lying down while you are having a nosebleed. Sit up and lean forward.  Use a nasal spray decongestant to help with a nosebleed as directed by your health care provider.  Do not use petroleum jelly or mineral oil in your nose. These can drip into your lungs.  Maintain humidity in your home by using less air conditioning or by using a humidifier.  Aspirinand blood thinners make bleeding more likely. If you are prescribed these medicines and you suffer from nosebleeds, ask your health care provider if you should stop taking the medicines or adjust the dose. Do not stop medicines unless directed by your health care provider  Resume your normal activities as you are able, but avoid straining, lifting, or bending at the waist for several days.  If your nosebleed was caused by dry mucous membranes, use over-the-counter saline  nasal spray or gel. This will keep the mucous membranes moist and allow them to heal. If you must use a lubricant, choose the water-soluble variety. Use it only sparingly, and do not use it within several hours of lying down.  Keep all follow-up visits as directed by your health care provider. This is important. SEEK MEDICAL CARE IF:  You have a fever.  You get frequent nosebleeds.  You are getting nosebleeds more often. SEEK IMMEDIATE MEDICAL CARE IF:  Your nosebleed lasts longer than 20 minutes.  Your nosebleed occurs after an injury to your face, and your nose looks crooked or broken.  You have unusual bleeding from other parts of your body.  You have unusual bruising on other parts of your body.  You feel light-headed or you faint.  You become sweaty.  You vomit blood.  Your nosebleed occurs after a head injury.   This information is not intended to replace advice given to you by your health care provider. Make sure you discuss any questions you have with your health care provider.   Document Released: 04/24/2005 Document Revised: 08/05/2014 Document Reviewed: 02/28/2014 Elsevier Interactive Patient Education [employer].

## 2015-11-02 ENCOUNTER — Ambulatory Visit (INDEPENDENT_AMBULATORY_CARE_PROVIDER_SITE_OTHER): Payer: Commercial Managed Care - HMO | Admitting: Cardiovascular Disease

## 2015-11-02 ENCOUNTER — Encounter: Payer: Self-pay | Admitting: Cardiovascular Disease

## 2015-11-02 VITALS — BP 176/80 | HR 58 | Ht 68.0 in | Wt 251.2 lb

## 2015-11-02 DIAGNOSIS — E7800 Pure hypercholesterolemia, unspecified: Secondary | ICD-10-CM

## 2015-11-02 DIAGNOSIS — I10 Essential (primary) hypertension: Secondary | ICD-10-CM | POA: Diagnosis not present

## 2015-11-02 DIAGNOSIS — I350 Nonrheumatic aortic (valve) stenosis: Secondary | ICD-10-CM | POA: Diagnosis not present
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
## 2015-11-02 NOTE — Patient Instructions (Signed)
Your physician recommends that you continue on your current medications as directed. Please refer to the Current Medication list given to you today.  Dr Sallyanne Kuster recommends that you schedule a follow-up appointment in 1 year. You will receive a reminder letter in the mail two months in advance. If you don't receive a letter, please call our office to schedule the follow-up appointment.  If you need a refill on your cardiac medications before your next appointment, please call your pharmacy.  **Please take your blood pressure daily. Please send a weeks worth of blood pressures via mychart or call the office to speak with a nurse.

## 2015-11-06 DIAGNOSIS — R040 Epistaxis: Secondary | ICD-10-CM | POA: Diagnosis not present

## 2015-11-06 DIAGNOSIS — J3489 Other specified disorders of nose and nasal sinuses: Secondary | ICD-10-CM | POA: Diagnosis not present

## 2015-11-09 DIAGNOSIS — R040 Epistaxis: Secondary | ICD-10-CM | POA: Diagnosis not present

## 2016-04-12 DIAGNOSIS — R040 Epistaxis: Secondary | ICD-10-CM | POA: Diagnosis not present

## 2016-05-13 DIAGNOSIS — H40003 Preglaucoma, unspecified, bilateral: Secondary | ICD-10-CM | POA: Diagnosis not present

## 2016-05-13 DIAGNOSIS — H2513 Age-related nuclear cataract, bilateral: Secondary | ICD-10-CM | POA: Diagnosis not present

## 2016-05-31 DIAGNOSIS — I10 Essential (primary) hypertension: Secondary | ICD-10-CM | POA: Diagnosis not present

## 2016-05-31 DIAGNOSIS — R7309 Other abnormal glucose: Secondary | ICD-10-CM | POA: Diagnosis not present

## 2016-05-31 DIAGNOSIS — Z125 Encounter for screening for malignant neoplasm of prostate: Secondary | ICD-10-CM | POA: Diagnosis not present

## 2016-05-31 DIAGNOSIS — E784 Other hyperlipidemia: Secondary | ICD-10-CM | POA: Diagnosis not present

## 2016-06-07 DIAGNOSIS — E668 Other obesity: Secondary | ICD-10-CM | POA: Diagnosis not present

## 2016-06-07 DIAGNOSIS — I350 Nonrheumatic aortic (valve) stenosis: Secondary | ICD-10-CM | POA: Diagnosis not present

## 2016-06-07 DIAGNOSIS — R7309 Other abnormal glucose: Secondary | ICD-10-CM | POA: Diagnosis not present

## 2016-06-07 DIAGNOSIS — R972 Elevated prostate specific antigen [PSA]: Secondary | ICD-10-CM | POA: Diagnosis not present

## 2016-06-07 DIAGNOSIS — I10 Essential (primary) hypertension: Secondary | ICD-10-CM | POA: Diagnosis not present

## 2016-06-07 DIAGNOSIS — D6489 Other specified anemias: Secondary | ICD-10-CM | POA: Diagnosis not present

## 2016-06-07 DIAGNOSIS — R0683 Snoring: Secondary | ICD-10-CM | POA: Diagnosis not present

## 2016-06-07 DIAGNOSIS — H4089 Other specified glaucoma: Secondary | ICD-10-CM | POA: Diagnosis not present

## 2016-06-07 DIAGNOSIS — Z0000 Encounter for general adult medical examination without abnormal findings: Secondary | ICD-10-CM | POA: Diagnosis not present

## 2016-07-08 DIAGNOSIS — Z1212 Encounter for screening for malignant neoplasm of rectum: Secondary | ICD-10-CM | POA: Diagnosis not present

## 2016-08-02 DIAGNOSIS — I350 Nonrheumatic aortic (valve) stenosis: Secondary | ICD-10-CM | POA: Diagnosis not present

## 2016-08-02 DIAGNOSIS — Z1389 Encounter for screening for other disorder: Secondary | ICD-10-CM | POA: Diagnosis not present

## 2016-08-02 DIAGNOSIS — Z6841 Body Mass Index (BMI) 40.0 and over, adult: Secondary | ICD-10-CM | POA: Diagnosis not present

## 2016-08-02 DIAGNOSIS — E784 Other hyperlipidemia: Secondary | ICD-10-CM | POA: Diagnosis not present

## 2016-08-02 DIAGNOSIS — R0683 Snoring: Secondary | ICD-10-CM | POA: Diagnosis not present

## 2016-08-02 DIAGNOSIS — I10 Essential (primary) hypertension: Secondary | ICD-10-CM | POA: Diagnosis not present

## 2016-08-11 DIAGNOSIS — R0683 Snoring: Secondary | ICD-10-CM | POA: Diagnosis not present

## 2016-08-12 DIAGNOSIS — H2513 Age-related nuclear cataract, bilateral: Secondary | ICD-10-CM | POA: Diagnosis not present

## 2016-08-12 DIAGNOSIS — H25013 Cortical age-related cataract, bilateral: Secondary | ICD-10-CM | POA: Diagnosis not present

## 2016-08-12 DIAGNOSIS — H401134 Primary open-angle glaucoma, bilateral, indeterminate stage: Secondary | ICD-10-CM | POA: Diagnosis not present

## 2016-08-12 DIAGNOSIS — H1789 Other corneal scars and opacities: Secondary | ICD-10-CM | POA: Diagnosis not present

## 2016-08-15 ENCOUNTER — Emergency Department (HOSPITAL_COMMUNITY)
Admission: EM | Admit: 2016-08-15 | Discharge: 2016-08-16 | Disposition: A | Discharge status: Home / Self Care | Payer: Medicare HMO | Attending: Emergency Medicine | Admitting: Emergency Medicine

## 2016-08-15 ENCOUNTER — Encounter (HOSPITAL_COMMUNITY): Payer: Self-pay

## 2016-08-15 DIAGNOSIS — Z87891 Personal history of nicotine dependence: Secondary | ICD-10-CM | POA: Insufficient documentation

## 2016-08-15 DIAGNOSIS — E119 Type 2 diabetes mellitus without complications: Secondary | ICD-10-CM | POA: Insufficient documentation

## 2016-08-15 DIAGNOSIS — R040 Epistaxis: Secondary | ICD-10-CM | POA: Insufficient documentation

## 2016-08-15 DIAGNOSIS — Z7982 Long term (current) use of aspirin: Secondary | ICD-10-CM | POA: Insufficient documentation

## 2016-08-15 DIAGNOSIS — I10 Essential (primary) hypertension: Secondary | ICD-10-CM

## 2016-08-15 DIAGNOSIS — Z79899 Other long term (current) drug therapy: Secondary | ICD-10-CM | POA: Diagnosis not present

## 2016-08-15 MED ORDER — OXYMETAZOLINE HCL 0.05 % NA SOLN
1.0000 | Freq: Once | NASAL | Status: AC
  Administered 2016-08-15: 1 via NASAL
  Filled 2016-08-15: qty 15

## 2016-08-15 NOTE — ED Triage Notes (Signed)
Pt states he checked BP at 6 pm and it read 204/104; Pt states he is on meds for HTN; pt BP on arrival is 161/70; Pt states his nose started to bleed while checking BP; pt presents with nose packing to right nares; No obvious active bleeding at triage Pt has low HR of 48-50 but states he has hx with meds of same; Pt a&ox 4 on arrival. Pt denies pain on arrival;  Pt denies being on blood thinners.
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
## 2016-08-16 DIAGNOSIS — H401133 Primary open-angle glaucoma, bilateral, severe stage: Secondary | ICD-10-CM | POA: Diagnosis not present

## 2016-10-08 DIAGNOSIS — H401133 Primary open-angle glaucoma, bilateral, severe stage: Secondary | ICD-10-CM | POA: Diagnosis not present

## 2016-10-25 ENCOUNTER — Encounter (HOSPITAL_COMMUNITY): Payer: Self-pay

## 2016-10-25 ENCOUNTER — Emergency Department (HOSPITAL_COMMUNITY)
Admission: EM | Admit: 2016-10-25 | Discharge: 2016-10-26 | Disposition: A | Discharge status: Home / Self Care | Payer: Medicare HMO | Attending: Emergency Medicine | Admitting: Emergency Medicine

## 2016-10-25 DIAGNOSIS — E119 Type 2 diabetes mellitus without complications: Secondary | ICD-10-CM | POA: Diagnosis not present

## 2016-10-25 DIAGNOSIS — I10 Essential (primary) hypertension: Secondary | ICD-10-CM | POA: Diagnosis not present

## 2016-10-25 DIAGNOSIS — Z7982 Long term (current) use of aspirin: Secondary | ICD-10-CM | POA: Diagnosis not present

## 2016-10-25 DIAGNOSIS — R040 Epistaxis: Secondary | ICD-10-CM

## 2016-10-25 DIAGNOSIS — Z87891 Personal history of nicotine dependence: Secondary | ICD-10-CM | POA: Insufficient documentation

## 2016-10-25 MED ORDER — OXYMETAZOLINE HCL 0.05 % NA SOLN
1.0000 | Freq: Once | NASAL | Status: AC
  Administered 2016-10-25: 1 via NASAL
  Filled 2016-10-25: qty 15

## 2016-10-25 NOTE — ED Triage Notes (Signed)
Pt complaining of R sided nose bleed. Pt states used afrin and has homemade packing in R nostril, no improvement. Pt denies any injury/trauma. Pt with hx of same.

## 2016-10-25 NOTE — ED Notes (Signed)
PA at bedside at this time.  

## 2016-10-26 MED ORDER — SALINE SPRAY 0.65 % NA SOLN
1.0000 | NASAL | Status: DC | PRN
Start: 2016-10-26 — End: 2016-10-26
  Administered 2016-10-26: 1 via NASAL
  Filled 2016-10-26: qty 44
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
## 2016-11-08 DIAGNOSIS — I350 Nonrheumatic aortic (valve) stenosis: Secondary | ICD-10-CM | POA: Diagnosis not present

## 2016-11-08 DIAGNOSIS — R0683 Snoring: Secondary | ICD-10-CM | POA: Diagnosis not present

## 2016-11-08 DIAGNOSIS — Z1389 Encounter for screening for other disorder: Secondary | ICD-10-CM | POA: Diagnosis not present

## 2016-11-08 DIAGNOSIS — R7309 Other abnormal glucose: Secondary | ICD-10-CM | POA: Diagnosis not present

## 2016-11-08 DIAGNOSIS — I10 Essential (primary) hypertension: Secondary | ICD-10-CM | POA: Diagnosis not present

## 2016-11-08 DIAGNOSIS — Z6839 Body mass index (BMI) 39.0-39.9, adult: Secondary | ICD-10-CM | POA: Diagnosis not present

## 2016-11-08 DIAGNOSIS — R040 Epistaxis: Secondary | ICD-10-CM | POA: Diagnosis not present

## 2016-11-21 ENCOUNTER — Encounter: Payer: Self-pay | Admitting: Cardiovascular Disease

## 2016-11-21 ENCOUNTER — Ambulatory Visit (INDEPENDENT_AMBULATORY_CARE_PROVIDER_SITE_OTHER): Payer: Medicare HMO | Admitting: Cardiovascular Disease

## 2016-11-21 VITALS — BP 152/66 | HR 53 | Ht 68.0 in | Wt 247.0 lb

## 2016-11-21 DIAGNOSIS — E7800 Pure hypercholesterolemia, unspecified: Secondary | ICD-10-CM

## 2016-11-21 DIAGNOSIS — I10 Essential (primary) hypertension: Secondary | ICD-10-CM

## 2016-11-21 DIAGNOSIS — I350 Nonrheumatic aortic (valve) stenosis: Secondary | ICD-10-CM
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
## 2016-11-21 NOTE — Patient Instructions (Signed)
Dr Croitoru recommends that you continue on your current medications as directed. Please refer to the Current Medication list given to you today.  Your physician has requested that you have an echocardiogram. Echocardiography is a painless test that uses sound waves to create images of your heart. It provides your doctor with information about the size and shape of your heart and how well your heart's chambers and valves are working. This procedure takes approximately one hour. There are no restrictions for this procedure. This will be performed at our [address] location - [address], [address].  Dr Croitoru recommends that you schedule a follow-up appointment in 12 months. You will receive a reminder letter in the mail two months in advance. If you don't receive a letter, please call our office to schedule the follow-up appointment.  If you need a refill on your cardiac medications before your next appointment, please call your pharmacy. 

## 2016-12-05 ENCOUNTER — Other Ambulatory Visit: Payer: Self-pay

## 2016-12-05 ENCOUNTER — Encounter (INDEPENDENT_AMBULATORY_CARE_PROVIDER_SITE_OTHER): Payer: Self-pay

## 2016-12-05 ENCOUNTER — Ambulatory Visit (HOSPITAL_COMMUNITY): Payer: Medicare HMO | Attending: Cardiovascular Disease

## 2016-12-05 DIAGNOSIS — I517 Cardiomegaly: Secondary | ICD-10-CM | POA: Diagnosis not present

## 2016-12-05 DIAGNOSIS — I350 Nonrheumatic aortic (valve) stenosis: Secondary | ICD-10-CM | POA: Diagnosis not present

## 2016-12-05 DIAGNOSIS — I352 Nonrheumatic aortic (valve) stenosis with insufficiency: Secondary | ICD-10-CM | POA: Insufficient documentation

## 2016-12-10 DIAGNOSIS — H401133 Primary open-angle glaucoma, bilateral, severe stage: Secondary | ICD-10-CM | POA: Diagnosis not present

## 2017-02-03 DIAGNOSIS — E669 Obesity, unspecified: Secondary | ICD-10-CM | POA: Diagnosis not present

## 2017-02-03 DIAGNOSIS — I350 Nonrheumatic aortic (valve) stenosis: Secondary | ICD-10-CM | POA: Diagnosis not present

## 2017-02-03 DIAGNOSIS — Z6839 Body mass index (BMI) 39.0-39.9, adult: Secondary | ICD-10-CM | POA: Diagnosis not present

## 2017-02-03 DIAGNOSIS — I10 Essential (primary) hypertension: Secondary | ICD-10-CM | POA: Diagnosis not present

## 2017-02-17 DIAGNOSIS — I350 Nonrheumatic aortic (valve) stenosis: Secondary | ICD-10-CM | POA: Diagnosis not present

## 2017-02-17 DIAGNOSIS — Z6839 Body mass index (BMI) 39.0-39.9, adult: Secondary | ICD-10-CM | POA: Diagnosis not present

## 2017-02-17 DIAGNOSIS — R7309 Other abnormal glucose: Secondary | ICD-10-CM | POA: Diagnosis not present

## 2017-02-17 DIAGNOSIS — I10 Essential (primary) hypertension: Secondary | ICD-10-CM | POA: Diagnosis not present

## 2017-02-17 DIAGNOSIS — E668 Other obesity: Secondary | ICD-10-CM | POA: Diagnosis not present

## 2017-02-17 DIAGNOSIS — R0981 Nasal congestion: Secondary | ICD-10-CM | POA: Diagnosis not present

## 2017-03-20 DIAGNOSIS — Z6841 Body Mass Index (BMI) 40.0 and over, adult: Secondary | ICD-10-CM | POA: Diagnosis not present

## 2017-03-20 DIAGNOSIS — I10 Essential (primary) hypertension: Secondary | ICD-10-CM | POA: Diagnosis not present

## 2017-04-14 DIAGNOSIS — H25013 Cortical age-related cataract, bilateral: Secondary | ICD-10-CM | POA: Diagnosis not present

## 2017-04-14 DIAGNOSIS — H401133 Primary open-angle glaucoma, bilateral, severe stage: Secondary | ICD-10-CM | POA: Diagnosis not present

## 2017-04-14 DIAGNOSIS — H2513 Age-related nuclear cataract, bilateral: Secondary | ICD-10-CM | POA: Diagnosis not present

## 2017-05-01 DIAGNOSIS — I10 Essential (primary) hypertension: Secondary | ICD-10-CM | POA: Diagnosis not present

## 2017-05-01 DIAGNOSIS — Z6841 Body Mass Index (BMI) 40.0 and over, adult: Secondary | ICD-10-CM | POA: Diagnosis not present

## 2017-05-22 DIAGNOSIS — I10 Essential (primary) hypertension: Secondary | ICD-10-CM | POA: Diagnosis not present

## 2017-05-22 DIAGNOSIS — Z6841 Body Mass Index (BMI) 40.0 and over, adult: Secondary | ICD-10-CM | POA: Diagnosis not present

## 2017-06-01 ENCOUNTER — Encounter (HOSPITAL_COMMUNITY): Payer: Self-pay | Admitting: Oncology

## 2017-06-01 ENCOUNTER — Emergency Department (HOSPITAL_COMMUNITY)
Admission: EM | Admit: 2017-06-01 | Discharge: 2017-06-01 | Disposition: A | Discharge status: Home / Self Care | Payer: Medicare HMO | Source: Home / Self Care | Attending: Emergency Medicine | Admitting: Emergency Medicine

## 2017-06-01 ENCOUNTER — Other Ambulatory Visit: Payer: Self-pay

## 2017-06-01 ENCOUNTER — Emergency Department (HOSPITAL_COMMUNITY)
Admission: EM | Admit: 2017-06-01 | Discharge: 2017-06-01 | Disposition: A | Discharge status: Home / Self Care | Payer: Medicare HMO | Attending: Emergency Medicine | Admitting: Emergency Medicine

## 2017-06-01 ENCOUNTER — Encounter (HOSPITAL_COMMUNITY): Payer: Self-pay

## 2017-06-01 DIAGNOSIS — E119 Type 2 diabetes mellitus without complications: Secondary | ICD-10-CM

## 2017-06-01 DIAGNOSIS — R040 Epistaxis: Secondary | ICD-10-CM

## 2017-06-01 DIAGNOSIS — Z87891 Personal history of nicotine dependence: Secondary | ICD-10-CM | POA: Insufficient documentation

## 2017-06-01 DIAGNOSIS — Z79899 Other long term (current) drug therapy: Secondary | ICD-10-CM | POA: Insufficient documentation

## 2017-06-01 DIAGNOSIS — I10 Essential (primary) hypertension: Secondary | ICD-10-CM | POA: Insufficient documentation

## 2017-06-01 MED ORDER — OXYMETAZOLINE HCL 0.05 % NA SOLN
2.0000 | Freq: Once | NASAL | Status: AC
  Administered 2017-06-01: 2 via NASAL
  Filled 2017-06-01: qty 15
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
## 2017-06-01 NOTE — ED Triage Notes (Signed)
Patient seen early am for nosebleed, discharged home and reports recurrent bleeding from right nare, no bleeding on arrival, NAD

## 2017-06-01 NOTE — Discharge Instructions (Signed)
Go home and rest.  Ice pack.  He may have a small amount of drainage.  Call the ear nose and throat doctor in the morning.  They will most likely see you approximately Wednesday.
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
## 2017-06-01 NOTE — ED Triage Notes (Signed)
Pt presents d/t epistaxis x 1 hour.  Denies injury.  Used afrin PTA.

## 2017-06-02 DIAGNOSIS — R040 Epistaxis: Secondary | ICD-10-CM | POA: Diagnosis not present

## 2017-06-06 DIAGNOSIS — R040 Epistaxis: Secondary | ICD-10-CM | POA: Diagnosis not present

## 2017-08-12 DIAGNOSIS — H401133 Primary open-angle glaucoma, bilateral, severe stage: Secondary | ICD-10-CM | POA: Diagnosis not present

## 2017-10-06 DIAGNOSIS — R040 Epistaxis: Secondary | ICD-10-CM | POA: Diagnosis not present

## 2017-11-25 ENCOUNTER — Ambulatory Visit: Payer: Medicare HMO | Admitting: Cardiovascular Disease

## 2017-11-25 ENCOUNTER — Encounter: Payer: Self-pay | Admitting: Cardiovascular Disease

## 2017-11-25 VITALS — BP 156/62 | HR 68 | Ht 67.0 in | Wt 243.2 lb

## 2017-11-25 DIAGNOSIS — E7800 Pure hypercholesterolemia, unspecified: Secondary | ICD-10-CM | POA: Diagnosis not present

## 2017-11-25 DIAGNOSIS — I350 Nonrheumatic aortic (valve) stenosis: Secondary | ICD-10-CM

## 2017-11-25 DIAGNOSIS — I10 Essential (primary) hypertension: Secondary | ICD-10-CM | POA: Diagnosis not present

## 2017-11-25 NOTE — Patient Instructions (Signed)
Dr Croitoru recommends that you schedule a follow-up appointment in 12 months. You will receive a reminder letter in the mail two months in advance. If you don't receive a letter, please call our office to schedule the follow-up appointment.  If you need a refill on your cardiac medications before your next appointment, please call your pharmacy. 
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
## 2017-12-03 DIAGNOSIS — H401133 Primary open-angle glaucoma, bilateral, severe stage: Secondary | ICD-10-CM | POA: Diagnosis not present

## 2018-04-15 DIAGNOSIS — H524 Presbyopia: Secondary | ICD-10-CM | POA: Diagnosis not present

## 2018-04-15 DIAGNOSIS — H401133 Primary open-angle glaucoma, bilateral, severe stage: Secondary | ICD-10-CM | POA: Diagnosis not present

## 2018-04-15 DIAGNOSIS — H1789 Other corneal scars and opacities: Secondary | ICD-10-CM | POA: Diagnosis not present

## 2018-04-15 DIAGNOSIS — H2513 Age-related nuclear cataract, bilateral: Secondary | ICD-10-CM | POA: Diagnosis not present

## 2018-05-11 DIAGNOSIS — Z6841 Body Mass Index (BMI) 40.0 and over, adult: Secondary | ICD-10-CM | POA: Diagnosis not present

## 2018-05-11 DIAGNOSIS — R7309 Other abnormal glucose: Secondary | ICD-10-CM | POA: Diagnosis not present

## 2018-05-11 DIAGNOSIS — I10 Essential (primary) hypertension: Secondary | ICD-10-CM | POA: Diagnosis not present

## 2018-05-11 DIAGNOSIS — I350 Nonrheumatic aortic (valve) stenosis: Secondary | ICD-10-CM | POA: Diagnosis not present

## 2018-05-11 DIAGNOSIS — Z1389 Encounter for screening for other disorder: Secondary | ICD-10-CM | POA: Diagnosis not present

## 2018-06-03 DIAGNOSIS — Z6841 Body Mass Index (BMI) 40.0 and over, adult: Secondary | ICD-10-CM | POA: Diagnosis not present

## 2018-06-03 DIAGNOSIS — I10 Essential (primary) hypertension: Secondary | ICD-10-CM | POA: Diagnosis not present

## 2018-06-08 DIAGNOSIS — R030 Elevated blood-pressure reading, without diagnosis of hypertension: Secondary | ICD-10-CM | POA: Diagnosis not present

## 2018-06-08 DIAGNOSIS — I10 Essential (primary) hypertension: Secondary | ICD-10-CM | POA: Diagnosis not present

## 2018-07-29 HISTORY — PX: EYE SURGERY: SHX253

## 2018-08-11 DIAGNOSIS — Z6841 Body Mass Index (BMI) 40.0 and over, adult: Secondary | ICD-10-CM | POA: Diagnosis not present

## 2018-08-11 DIAGNOSIS — R0981 Nasal congestion: Secondary | ICD-10-CM | POA: Diagnosis not present

## 2018-08-11 DIAGNOSIS — I350 Nonrheumatic aortic (valve) stenosis: Secondary | ICD-10-CM | POA: Diagnosis not present

## 2018-08-11 DIAGNOSIS — I10 Essential (primary) hypertension: Secondary | ICD-10-CM | POA: Diagnosis not present

## 2018-08-11 DIAGNOSIS — R7309 Other abnormal glucose: Secondary | ICD-10-CM | POA: Diagnosis not present

## 2018-08-18 DIAGNOSIS — H401133 Primary open-angle glaucoma, bilateral, severe stage: Secondary | ICD-10-CM | POA: Diagnosis not present

## 2018-09-08 DIAGNOSIS — T1511XA Foreign body in conjunctival sac, right eye, initial encounter: Secondary | ICD-10-CM | POA: Diagnosis not present

## 2018-09-08 DIAGNOSIS — S0501XA Injury of conjunctiva and corneal abrasion without foreign body, right eye, initial encounter: Secondary | ICD-10-CM | POA: Diagnosis not present

## 2018-09-11 DIAGNOSIS — H2000 Unspecified acute and subacute iridocyclitis: Secondary | ICD-10-CM | POA: Diagnosis not present

## 2018-09-11 DIAGNOSIS — S0501XD Injury of conjunctiva and corneal abrasion without foreign body, right eye, subsequent encounter: Secondary | ICD-10-CM | POA: Diagnosis not present

## 2018-09-16 DIAGNOSIS — H401123 Primary open-angle glaucoma, left eye, severe stage: Secondary | ICD-10-CM | POA: Diagnosis not present

## 2018-10-14 DIAGNOSIS — H401113 Primary open-angle glaucoma, right eye, severe stage: Secondary | ICD-10-CM | POA: Diagnosis not present

## 2018-12-16 DIAGNOSIS — H401133 Primary open-angle glaucoma, bilateral, severe stage: Secondary | ICD-10-CM | POA: Diagnosis not present

## 2018-12-25 DIAGNOSIS — E785 Hyperlipidemia, unspecified: Secondary | ICD-10-CM | POA: Diagnosis not present

## 2018-12-25 DIAGNOSIS — I10 Essential (primary) hypertension: Secondary | ICD-10-CM | POA: Diagnosis not present

## 2018-12-25 DIAGNOSIS — N529 Male erectile dysfunction, unspecified: Secondary | ICD-10-CM | POA: Diagnosis not present

## 2019-02-03 ENCOUNTER — Telehealth: Payer: Self-pay | Admitting: *Deleted

## 2019-02-03 NOTE — Telephone Encounter (Signed)
Virtual Visit Pre-Appointment Phone Call  "(Name), I am calling you today to discuss your upcoming appointment. We are currently trying to limit exposure to the virus that causes COVID-19 by seeing patients at home rather than in the office."  1. "What is the BEST phone number to call the day of the visit?" - include this in appointment notes  2. "Do you have or have access to (through a family member/friend) a smartphone with video capability that we can use for your visit?" a. If yes - list this number in appt notes as "cell" (if different from BEST phone #) and list the appointment type as a VIDEO visit in appointment notes b. If no - list the appointment type as a PHONE visit in appointment notes  3. Confirm consent - "In the setting of the current Covid19 crisis, you are scheduled for a (phone or video) visit with your provider on (date) at (time).  Just as we do with many in-office visits, in order for you to participate in this visit, we must obtain consent.  If you'd like, I can send this to your mychart (if signed up) or email for you to review.  Otherwise, I can obtain your verbal consent now.  All virtual visits are billed to your insurance company just like a normal visit would be.  By agreeing to a virtual visit, we'd like you to understand that the technology does not allow for your provider to perform an examination, and thus may limit your provider's ability to fully assess your condition. If your provider identifies any concerns that need to be evaluated in person, we will make arrangements to do so.  Finally, though the technology is pretty good, we cannot assure that it will always work on either your or our end, and in the setting of a video visit, we may have to convert it to a phone-only visit.  In either situation, we cannot ensure that we have a secure connection.  Are you willing to proceed?"YES  4. Advise patient to be prepared - "Two hours prior to your appointment, go  ahead and check your blood pressure, pulse, oxygen saturation, and your weight (if you have the equipment to check those) and write them all down. When your visit starts, your provider will ask you for this information. If you have an Apple Watch or Kardia device, please plan to have heart rate information ready on the day of your appointment. Please have a pen and paper handy nearby the day of the visit as well."  5. Give patient instructions for MyChart download to smartphone OR [email or web address] as below if video visit (depending on what platform provider is using)  6. Inform patient they will receive a phone call 15 minutes prior to their appointment time (may be from unknown caller ID) so they should be prepared to answer    TELEPHONE CALL NOTE  Jakory Matsuo. has been deemed a candidate for a follow-up tele-health visit to limit community exposure during the Covid-19 pandemic. I spoke with the patient via phone to ensure availability of phone/video source, confirm preferred email & phone number, and discuss instructions and expectations.  I reminded Ericson Nafziger. to be prepared with any vital sign and/or heart rhythm information that could potentially be obtained via home monitoring, at the time of his visit. I reminded Rahshawn Remo. to expect a phone call prior to his visit.  Ricci Barker, RN 02/03/2019 1:36 PM   INSTRUCTIONS FOR  DOWNLOADING THE MYCHART APP TO SMARTPHONE  - The patient must first make sure to have activated MyChart and know their login information - If Apple, go to [employer] and type in MyChart in the search bar and download the app. If Android, ask patient to go to [employer] and type in [place] in the search bar and download the app. The app is free but as with any other app downloads, their phone may require them to verify saved payment information or Apple/Android password.  - The patient will need to then log into the app with their MyChart  username and password, and select [facility] as their healthcare provider to link the account. When it is time for your visit, go to the MyChart app, find appointments, and click Begin Video Visit. Be sure to Select Allow for your device to access the Microphone and Camera for your visit. You will then be connected, and your provider will be with you shortly.  **If they have any issues connecting, or need assistance please contact MyChart service desk [phone])**  **If using a computer, in order to ensure the best quality for their visit they will need to use either of the following Internet Browsers: [employer], or Google Chrome**  IF USING DOXIMITY or DOXY.ME - The patient will receive a link just prior to their visit by text.     FULL LENGTH CONSENT FOR TELE-HEALTH VISIT   I hereby voluntarily request, consent and authorize [facility] and its employed or contracted physicians, physician assistants, nurse practitioners or other licensed health care professionals (the Practitioner), to provide me with telemedicine health care services (the "Services") as deemed necessary by the treating Practitioner. I acknowledge and consent to receive the Services by the Practitioner via telemedicine. I understand that the telemedicine visit will involve communicating with the Practitioner through live audiovisual communication technology and the disclosure of certain medical information by electronic transmission. I acknowledge that I have been given the opportunity to request an in-person assessment or other available alternative prior to the telemedicine visit and am voluntarily participating in the telemedicine visit.  I understand that I have the right to withhold or withdraw my consent to the use of telemedicine in the course of my care at any time, without affecting my right to future care or treatment, and that the Practitioner or I may terminate the telemedicine visit at any  time. I understand that I have the right to inspect all information obtained and/or recorded in the course of the telemedicine visit and may receive copies of available information for a reasonable fee.  I understand that some of the potential risks of receiving the Services via telemedicine include:  Marland Kitchen Delay or interruption in medical evaluation due to technological equipment failure or disruption; . Information transmitted may not be sufficient (e.g. poor resolution of images) to allow for appropriate medical decision making by the Practitioner; and/or  . In rare instances, security protocols could fail, causing a breach of personal health information.  Furthermore, I acknowledge that it is my responsibility to provide information about my medical history, conditions and care that is complete and accurate to the best of my ability. I acknowledge that Practitioner's advice, recommendations, and/or decision may be based on factors not within their control, such as incomplete or inaccurate data provided by me or distortions of diagnostic images or specimens that may result from electronic transmissions. I understand that the practice of medicine is not an exact science and that  Practitioner makes no warranties or guarantees regarding treatment outcomes. I acknowledge that I will receive a copy of this consent concurrently upon execution via email to the email address I last provided but may also request a printed copy by calling the office of [facility].    I understand that my insurance will be billed for this visit.   I have read or had this consent read to me. . I understand the contents of this consent, which adequately explains the benefits and risks of the Services being provided via telemedicine.  . I have been provided ample opportunity to ask questions regarding this consent and the Services and have had my questions answered to my satisfaction. . I give my informed consent for the services  to be provided through the use of telemedicine in my medical care  By participating in this telemedicine visit I agree to the above.

## 2019-02-04 ENCOUNTER — Telehealth (INDEPENDENT_AMBULATORY_CARE_PROVIDER_SITE_OTHER): Payer: Medicare HMO | Admitting: Cardiovascular Disease

## 2019-02-04 VITALS — BP 163/70 | HR 49 | Ht 68.0 in | Wt 248.0 lb

## 2019-02-04 DIAGNOSIS — I350 Nonrheumatic aortic (valve) stenosis: Secondary | ICD-10-CM

## 2019-02-04 DIAGNOSIS — E7800 Pure hypercholesterolemia, unspecified: Secondary | ICD-10-CM

## 2019-02-04 DIAGNOSIS — I10 Essential (primary) hypertension: Secondary | ICD-10-CM

## 2019-02-04 NOTE — Patient Instructions (Signed)
Medication Instructions:  Your physician recommends that you continue on your current medications as directed. Please refer to the Current Medication list given to you today.  If you need a refill on your cardiac medications before your next appointment, please call your pharmacy.   Lab work: None ordered  Testing/Procedures: Your physician has requested that you have an echocardiogram. Echocardiography is a painless test that uses sound waves to create images of your heart. It provides your doctor with information about the size and shape of your heart and how well your heart's chambers and valves are working. You may receive an ultrasound enhancing agent through an IV if needed to better visualize your heart during the echo.This procedure takes approximately one hour. There are no restrictions for this procedure. This will take place at the [address].    Follow-Up: At [facility], you and your health needs are our priority.  As part of our continuing mission to provide you with exceptional heart care, we have created designated Provider Care Teams.  These Care Teams include your primary Cardiologist (physician) and Advanced Practice Providers (APPs -  Physician Assistants and Nurse Practitioners) who all work together to provide you with the care you need, when you need it. You will need a follow up appointment in 12 months.  Please call our office 2 months in advance to schedule this appointment.  You may see Sanda Klein, MD or one of the following Advanced Practice Providers on your designated Care Team: Almyra Deforest, PA-C Fabian Sharp, [place]
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
## 2019-02-08 ENCOUNTER — Telehealth: Payer: Self-pay | Admitting: Cardiovascular Disease

## 2019-02-08 NOTE — Telephone Encounter (Signed)
LM for patient to call and schedule echo.

## 2019-02-11 ENCOUNTER — Other Ambulatory Visit: Payer: Self-pay

## 2019-02-11 ENCOUNTER — Telehealth: Payer: Self-pay | Admitting: *Deleted

## 2019-02-11 ENCOUNTER — Ambulatory Visit (HOSPITAL_COMMUNITY): Payer: Medicare HMO | Attending: Internal Medicine

## 2019-02-11 DIAGNOSIS — I350 Nonrheumatic aortic (valve) stenosis: Secondary | ICD-10-CM | POA: Insufficient documentation

## 2019-02-11 NOTE — Telephone Encounter (Signed)
-----   Message from Sanda Klein, MD sent at 02/11/2019  1:20 PM EDT ----- Good news.  Heart pumping function remains normal and there has been very little change in the severity of aortic stenosis.  Please remind him to call us if he develops exertional dyspnea, exertional angina, exertional syncope.

## 2019-02-11 NOTE — Telephone Encounter (Signed)
Left a message for the patient to call back.  

## 2019-02-17 NOTE — Telephone Encounter (Signed)
Patient made aware of results and verbalized understanding.  

## 2019-02-17 NOTE — Telephone Encounter (Signed)
Left a message for the patient to call back.  

## 2019-02-22 ENCOUNTER — Emergency Department (HOSPITAL_COMMUNITY): Payer: Medicare HMO

## 2019-02-22 ENCOUNTER — Emergency Department (HOSPITAL_COMMUNITY)
Admission: EM | Admit: 2019-02-22 | Discharge: 2019-02-22 | Disposition: A | Discharge status: Home / Self Care | Payer: Medicare HMO | Attending: Emergency Medicine | Admitting: Emergency Medicine

## 2019-02-22 ENCOUNTER — Other Ambulatory Visit: Payer: Self-pay

## 2019-02-22 DIAGNOSIS — Z862 Personal history of diseases of the blood and blood-forming organs and certain disorders involving the immune mechanism: Secondary | ICD-10-CM | POA: Insufficient documentation

## 2019-02-22 DIAGNOSIS — R509 Fever, unspecified: Secondary | ICD-10-CM | POA: Insufficient documentation

## 2019-02-22 DIAGNOSIS — Z79899 Other long term (current) drug therapy: Secondary | ICD-10-CM | POA: Insufficient documentation

## 2019-02-22 DIAGNOSIS — Z20822 Contact with and (suspected) exposure to covid-19: Secondary | ICD-10-CM

## 2019-02-22 DIAGNOSIS — Z20828 Contact with and (suspected) exposure to other viral communicable diseases: Secondary | ICD-10-CM | POA: Diagnosis not present

## 2019-02-22 DIAGNOSIS — Z87891 Personal history of nicotine dependence: Secondary | ICD-10-CM | POA: Diagnosis not present

## 2019-02-22 DIAGNOSIS — I10 Essential (primary) hypertension: Secondary | ICD-10-CM | POA: Diagnosis not present

## 2019-02-22 DIAGNOSIS — E119 Type 2 diabetes mellitus without complications: Secondary | ICD-10-CM | POA: Insufficient documentation

## 2019-02-22 DIAGNOSIS — R600 Localized edema: Secondary | ICD-10-CM | POA: Diagnosis not present

## 2019-02-22 LAB — CBC WITH DIFFERENTIAL/PLATELET
Abs Immature Granulocytes: 0 10*3/uL (ref 0.00–0.07)
Basophils Absolute: 0 10*3/uL (ref 0.0–0.1)
Basophils Relative: 0 %
Eosinophils Absolute: 0 10*3/uL (ref 0.0–0.5)
Eosinophils Relative: 0 %
HCT: 40.6 % (ref 39.0–52.0)
Hemoglobin: 14.4 g/dL (ref 13.0–17.0)
Lymphocytes Relative: 6 %
Lymphs Abs: 0.4 10*3/uL — ABNORMAL LOW (ref 0.7–4.0)
MCH: 32.2 pg (ref 26.0–34.0)
MCHC: 35.5 g/dL (ref 30.0–36.0)
MCV: 90.8 fL (ref 80.0–100.0)
Monocytes Absolute: 0 10*3/uL — ABNORMAL LOW (ref 0.1–1.0)
Monocytes Relative: 0 %
Neutro Abs: 6.5 10*3/uL (ref 1.7–7.7)
Neutrophils Relative %: 94 %
Platelets: 166 10*3/uL (ref 150–400)
RBC: 4.47 MIL/uL (ref 4.22–5.81)
RDW: 12 % (ref 11.5–15.5)
WBC: 6.9 10*3/uL (ref 4.0–10.5)
nRBC: 0 % (ref 0.0–0.2)
nRBC: 0 /100 WBC

## 2019-02-22 LAB — PROTIME-INR
INR: 1.1 (ref 0.8–1.2)
Prothrombin Time: 13.9 seconds (ref 11.4–15.2)

## 2019-02-22 LAB — COMPREHENSIVE METABOLIC PANEL
ALT: 27 U/L (ref 0–44)
AST: 45 U/L — ABNORMAL HIGH (ref 15–41)
Albumin: 3.4 g/dL — ABNORMAL LOW (ref 3.5–5.0)
Alkaline Phosphatase: 57 U/L (ref 38–126)
Anion gap: 13 (ref 5–15)
BUN: 7 mg/dL — ABNORMAL LOW (ref 8–23)
CO2: 21 mmol/L — ABNORMAL LOW (ref 22–32)
Calcium: 8.6 mg/dL — ABNORMAL LOW (ref 8.9–10.3)
Chloride: 100 mmol/L (ref 98–111)
Creatinine, Ser: 1.06 mg/dL (ref 0.61–1.24)
GFR calc Af Amer: >60 mL/min (ref >60)
GFR calc non Af Amer: >60 mL/min (ref >60)
Glucose, Bld: 98 mg/dL (ref 70–99)
Potassium: 3.4 mmol/L — ABNORMAL LOW (ref 3.5–5.1)
Sodium: 134 mmol/L — ABNORMAL LOW (ref 135–145)
Total Bilirubin: 1.2 mg/dL (ref 0.3–1.2)
Total Protein: 7.1 g/dL (ref 6.5–8.1)

## 2019-02-22 LAB — URINALYSIS, ROUTINE W REFLEX MICROSCOPIC
Bacteria, UA: NONE SEEN
Bilirubin Urine: NEGATIVE
Glucose, UA: NEGATIVE mg/dL
Ketones, ur: 5 mg/dL — AB
Leukocytes,Ua: NEGATIVE
Nitrite: NEGATIVE
Protein, ur: 100 mg/dL — AB
Specific Gravity, Urine: 1.011 (ref 1.005–1.030)
pH: 6 (ref 5.0–8.0)

## 2019-02-22 LAB — LACTIC ACID, PLASMA: Lactic Acid, Venous: 1.3 mmol/L (ref 0.5–1.9)

## 2019-02-22 LAB — APTT: aPTT: 32 seconds (ref 24–36)

## 2019-02-22 MED ORDER — ACETAMINOPHEN 325 MG PO TABS: 650.0000 mg | ORAL_TABLET | Freq: Four times a day (QID) | ORAL | 0 refills | Status: DC | PRN

## 2019-02-22 NOTE — ED Triage Notes (Signed)
Pt reports he had exposure to a Postive COVID pt. Pt reports he started having a fever last Sunday. Pt reports he was tested on Friday, awaiting results. Pt reports cough, denies [facility]
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
## 2019-02-22 NOTE — ED Notes (Signed)
Radiology at bedside for CXR

## 2019-02-22 NOTE — ED Notes (Signed)
Pt family en route to pick up patient. D/C instructions reviewed with pt, verbalized understanding of COVID care instructions.

## 2019-02-23 LAB — URINE CULTURE: Culture: NO GROWTH

## 2019-02-27 LAB — CULTURE, BLOOD (ROUTINE X 2)
Culture: NO GROWTH
Culture: NO GROWTH
Special Requests: ADEQUATE

## 2019-04-08 DIAGNOSIS — R739 Hyperglycemia, unspecified: Secondary | ICD-10-CM | POA: Diagnosis not present

## 2019-04-08 DIAGNOSIS — R82998 Other abnormal findings in urine: Secondary | ICD-10-CM | POA: Diagnosis not present

## 2019-04-08 DIAGNOSIS — E7849 Other hyperlipidemia: Secondary | ICD-10-CM | POA: Diagnosis not present

## 2019-04-08 DIAGNOSIS — Z125 Encounter for screening for malignant neoplasm of prostate: Secondary | ICD-10-CM | POA: Diagnosis not present

## 2019-04-14 DIAGNOSIS — Z1212 Encounter for screening for malignant neoplasm of rectum: Secondary | ICD-10-CM | POA: Diagnosis not present

## 2019-04-16 ENCOUNTER — Encounter: Payer: Self-pay | Admitting: Gastroenterology

## 2019-04-16 DIAGNOSIS — R739 Hyperglycemia, unspecified: Secondary | ICD-10-CM | POA: Diagnosis not present

## 2019-04-16 DIAGNOSIS — R195 Other fecal abnormalities: Secondary | ICD-10-CM | POA: Diagnosis not present

## 2019-04-16 DIAGNOSIS — N529 Male erectile dysfunction, unspecified: Secondary | ICD-10-CM | POA: Diagnosis not present

## 2019-04-16 DIAGNOSIS — R972 Elevated prostate specific antigen [PSA]: Secondary | ICD-10-CM | POA: Diagnosis not present

## 2019-04-16 DIAGNOSIS — I10 Essential (primary) hypertension: Secondary | ICD-10-CM | POA: Diagnosis not present

## 2019-04-16 DIAGNOSIS — Z0000 Encounter for general adult medical examination without abnormal findings: Secondary | ICD-10-CM | POA: Diagnosis not present

## 2019-04-16 DIAGNOSIS — E785 Hyperlipidemia, unspecified: Secondary | ICD-10-CM | POA: Diagnosis not present

## 2019-04-16 DIAGNOSIS — I350 Nonrheumatic aortic (valve) stenosis: Secondary | ICD-10-CM | POA: Diagnosis not present

## 2019-04-21 DIAGNOSIS — H04121 Dry eye syndrome of right lacrimal gland: Secondary | ICD-10-CM | POA: Diagnosis not present

## 2019-04-21 DIAGNOSIS — H2513 Age-related nuclear cataract, bilateral: Secondary | ICD-10-CM | POA: Diagnosis not present

## 2019-04-21 DIAGNOSIS — H401133 Primary open-angle glaucoma, bilateral, severe stage: Secondary | ICD-10-CM | POA: Diagnosis not present

## 2019-04-21 DIAGNOSIS — H52203 Unspecified astigmatism, bilateral: Secondary | ICD-10-CM | POA: Diagnosis not present

## 2019-05-20 ENCOUNTER — Other Ambulatory Visit: Payer: Self-pay

## 2019-05-20 ENCOUNTER — Encounter: Payer: Self-pay | Admitting: Gastroenterology

## 2019-05-20 ENCOUNTER — Ambulatory Visit: Payer: Medicare HMO | Admitting: Gastroenterology

## 2019-05-20 VITALS — BP 122/78 | HR 58 | Temp 98.3°F | Ht 68.0 in | Wt 244.1 lb

## 2019-05-20 DIAGNOSIS — D509 Iron deficiency anemia, unspecified: Secondary | ICD-10-CM

## 2019-05-20 DIAGNOSIS — R195 Other fecal abnormalities: Secondary | ICD-10-CM | POA: Diagnosis not present

## 2019-05-20 MED ORDER — SUPREP BOWEL PREP KIT 17.5-3.13-1.6 GM/177ML PO SOLN: ORAL | 0 refills | Status: DC
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
## 2019-05-20 NOTE — Patient Instructions (Addendum)
If you are age 73 or older, your body mass index should be between 23-30. Your Body mass index is 37.12 kg/m. If this is out of the aforementioned range listed, please consider follow up with your Primary Care Provider.  If you are age 78 or younger, your body mass index should be between 19-25. Your Body mass index is 37.12 kg/m. If this is out of the aformentioned range listed, please consider follow up with your Primary Care Provider.   To help prevent the possible spread of infection to our patients, communities, and staff; we will be implementing the following measures:  As of now we are not allowing any visitors/family members to accompany you to any upcoming appointments with [facility]. If you have any concerns about this please contact our office to discuss prior to the appointment.   You have been scheduled for an endoscopy and colonoscopy. Please follow the written instructions given to you at your visit today. Please pick up your prep supplies at the pharmacy within the next 1-3 days. If you use inhalers (even only as needed), please bring them with you on the day of your procedure.  Thank you for entrusting me with your care and for choosing [facility], Dr. [facility] Cellar    .

## 2019-05-24 ENCOUNTER — Ambulatory Visit (AMBULATORY_SURGERY_CENTER): Payer: Medicare HMO | Admitting: Gastroenterology

## 2019-05-24 ENCOUNTER — Encounter: Payer: Self-pay | Admitting: Gastroenterology

## 2019-05-24 ENCOUNTER — Other Ambulatory Visit: Payer: Self-pay

## 2019-05-24 VITALS — BP 170/72 | HR 50 | Temp 97.5°F | Resp 18 | Ht 68.0 in | Wt 244.0 lb

## 2019-05-24 DIAGNOSIS — B9681 Helicobacter pylori [H. pylori] as the cause of diseases classified elsewhere: Secondary | ICD-10-CM | POA: Diagnosis not present

## 2019-05-24 DIAGNOSIS — K2951 Unspecified chronic gastritis with bleeding: Secondary | ICD-10-CM | POA: Diagnosis not present

## 2019-05-24 DIAGNOSIS — D509 Iron deficiency anemia, unspecified: Secondary | ICD-10-CM

## 2019-05-24 DIAGNOSIS — K648 Other hemorrhoids: Secondary | ICD-10-CM

## 2019-05-24 DIAGNOSIS — K2970 Gastritis, unspecified, without bleeding: Secondary | ICD-10-CM

## 2019-05-24 DIAGNOSIS — D125 Benign neoplasm of sigmoid colon: Secondary | ICD-10-CM

## 2019-05-24 DIAGNOSIS — R195 Other fecal abnormalities: Secondary | ICD-10-CM

## 2019-05-24 DIAGNOSIS — K5730 Diverticulosis of large intestine without perforation or abscess without bleeding: Secondary | ICD-10-CM | POA: Diagnosis not present

## 2019-05-24 MED ORDER — SODIUM CHLORIDE 0.9 % IV SOLN: 500.0000 mL | Freq: Once | INTRAVENOUS | Status: DC

## 2019-05-24 NOTE — Progress Notes (Signed)
Called to room to assist during endoscopic procedure.  Patient ID and intended procedure confirmed with present staff. Received instructions for my participation in the procedure from the performing physician.  

## 2019-05-24 NOTE — Progress Notes (Signed)
PT taken to PACU. Monitors in place. VSS. Report given to RN. 

## 2019-05-24 NOTE — Patient Instructions (Signed)
Thank you for allowing [place] to care for you today!  Await pathology results, approximately 2 weeks.  Recommendations will be made at that time.  Resume previous diet and medications today.  Return to your normal activities tomorrow.   YOU HAD AN ENDOSCOPIC PROCEDURE TODAY AT [facility]:   Refer to the procedure report that was given to you for any specific questions about what was found during the examination.  If the procedure report does not answer your questions, please call your gastroenterologist to clarify.  If you requested that your care partner not be given the details of your procedure findings, then the procedure report has been included in a sealed envelope for you to review at your convenience later.  YOU SHOULD EXPECT: Some feelings of bloating in the abdomen. Passage of more gas than usual.  Walking can help get rid of the air that was put into your GI tract during the procedure and reduce the bloating. If you had a lower endoscopy (such as a colonoscopy or flexible sigmoidoscopy) you may notice spotting of blood in your stool or on the toilet paper. If you underwent a bowel prep for your procedure, you may not have a normal bowel movement for a few days.  Please Note:  You might notice some irritation and congestion in your nose or some drainage.  This is from the oxygen used during your procedure.  There is no need for concern and it should clear up in a day or so.  SYMPTOMS TO REPORT IMMEDIATELY:   Following lower endoscopy (colonoscopy or flexible sigmoidoscopy):  Excessive amounts of blood in the stool  Significant tenderness or worsening of abdominal pains  Swelling of the abdomen that is new, acute  Fever of 100F or higher   Following upper endoscopy (EGD)  Vomiting of blood or coffee ground material  New chest pain or pain under the shoulder blades  Painful or persistently difficult swallowing  New shortness of breath  Fever of 100F or  higher  Black, tarry-looking stools  For urgent or emergent issues, a gastroenterologist can be reached at any hour by calling [phone].   DIET:  We do recommend a small meal at first, but then you may proceed to your regular diet.  Drink plenty of fluids but you should avoid alcoholic beverages for 24 hours.  ACTIVITY:  You should plan to take it easy for the rest of today and you should NOT DRIVE or use heavy machinery until tomorrow (because of the sedation medicines used during the test).    FOLLOW UP: Our staff will call the number listed on your records 48-72 hours following your procedure to check on you and address any questions or concerns that you may have regarding the information given to you following your procedure. If we do not reach you, we will leave a message.  We will attempt to reach you two times.  During this call, we will ask if you have developed any symptoms of COVID 19. If you develop any symptoms (ie: fever, flu-like symptoms, shortness of breath, cough etc.) before then, please call [phone].  If you test positive for Covid 19 in the 2 weeks post procedure, please call and report this information to [place].    If any biopsies were taken you will be contacted by phone or by letter within the next 1-3 weeks.  Please call us at [phone] if you have not heard about the biopsies in 3 weeks.  SIGNATURES/CONFIDENTIALITY: You and/or your care partner have signed paperwork which will be entered into your electronic medical record.  These signatures attest to the fact that that the information above on your After Visit Summary has been reviewed and is understood.  Full responsibility of the confidentiality of this discharge information lies with you and/or your care-partner.

## 2019-05-24 NOTE — Op Note (Signed)
[facility] Patient Name: Bernard Garcia Procedure Date: 05/24/2019 2:20 PM MRN: [id number] Endoscopist: Remo Lipps P. Havery [name] , MD Age: 73 Referring MD:  Date of Birth: 09/27/1945 Gender: Male Account #: [id number] Procedure:                Upper GI endoscopy Indications:              history of iron deficiency anemia Medicines:                Monitored Anesthesia Care Procedure:                Pre-Anesthesia Assessment:                           - Prior to the procedure, a History and Physical                            was performed, and patient medications and                            allergies were reviewed. The patient's tolerance of                            previous anesthesia was also reviewed. The risks                            and benefits of the procedure and the sedation                            options and risks were discussed with the patient.                            All questions were answered, and informed consent                            was obtained. Prior Anticoagulants: The patient has                            taken no previous anticoagulant or antiplatelet                            agents. ASA Grade Assessment: III - A patient with                            severe systemic disease. After reviewing the risks                            and benefits, the patient was deemed in                            satisfactory condition to undergo the procedure.                           After obtaining informed consent, the endoscope was  passed under direct vision. Throughout the                            procedure, the patient's blood pressure, pulse, and                            oxygen saturations were monitored continuously. The                            Endoscope was introduced through the mouth, and                            advanced to the second part of duodenum. The upper                            GI endoscopy  was accomplished without difficulty.                            The patient tolerated the procedure well. Scope In: Scope Out: Findings:                 Esophagogastric landmarks were identified: the                            Z-line was found at 40 cm, the gastroesophageal                            junction was found at 40 cm and the upper extent of                            the gastric folds was found at 40 cm from the                            incisors.                           The exam of the esophagus was otherwise normal.                           Diffuse mildly erythematous mucosa was found in the                            gastric fundus and in the gastric body without                            focal ulceration.                           The exam of the stomach was otherwise normal.                           Biopsies were taken with a cold forceps in the  gastric body, at the incisura and in the gastric                            antrum for Helicobacter pylori testing.                           The ampulla appeared slightly prominent. Biopsies                            were taken with a cold forceps for histology to                            ensure adenomatous change                           The exam of the duodenum was otherwise normal. Complications:            No immediate complications. Estimated blood loss:                            Minimal. Estimated Blood Loss:     Estimated blood loss was minimal. Impression:               - Esophagogastric landmarks identified.                           - Normal esophagus otherwise                           - Erythematous mucosa in the gastric fundus and                            gastric body without focal ulcerations. Biopsied to                            rule out H pylori                           - Normal stomach otherwise.                           - Slightly prominent ampulla - biopsied to rule  out                            adenomatous change.                           - Normal duodenum otherwise. Recommendation:           - Patient has a contact number available for                            emergencies. The signs and symptoms of potential                            delayed complications were discussed with the  patient. Return to normal activities tomorrow.                            Written discharge instructions were provided to the                            patient.                           - Resume previous diet.                           - Continue present medications.                           - Await pathology results. Remo Lipps P. Tzivia Oneil, MD 05/24/2019 3:14:56 PM This report has been signed electronically.

## 2019-05-24 NOTE — Op Note (Signed)
[facility] Patient Name: Bernard Garcia Procedure Date: 05/24/2019 2:20 PM MRN: [id number] Endoscopist: Remo Lipps P. Havery [name] , MD Age: 73 Referring MD:  Date of Birth: [birth year] Gender: Male Account #: [id number] Procedure:                Colonoscopy Indications:              Heme positive stool, history of iron deficiency Medicines:                Monitored Anesthesia Care Procedure:                Pre-Anesthesia Assessment:                           - Prior to the procedure, a History and Physical                            was performed, and patient medications and                            allergies were reviewed. The patient's tolerance of                            previous anesthesia was also reviewed. The risks                            and benefits of the procedure and the sedation                            options and risks were discussed with the patient.                            All questions were answered, and informed consent                            was obtained. Prior Anticoagulants: The patient has                            taken no previous anticoagulant or antiplatelet                            agents. ASA Grade Assessment: III - A patient with                            severe systemic disease. After reviewing the risks                            and benefits, the patient was deemed in                            satisfactory condition to undergo the procedure.                           After obtaining informed consent, the colonoscope  was passed under direct vision. Throughout the                            procedure, the patient's blood pressure, pulse, and                            oxygen saturations were monitored continuously. The                            Colonoscope was introduced through the anus and                            advanced to the the terminal ileum, with                            identification  of the appendiceal orifice and IC                            valve. The colonoscopy was performed without                            difficulty. The patient tolerated the procedure                            well. The quality of the bowel preparation was                            good. The terminal ileum, ileocecal valve,                            appendiceal orifice, and rectum were photographed. Scope In: 2:45:03 PM Scope Out: 3:01:12 PM Scope Withdrawal Time: 0 hours 12 minutes 56 seconds  Total Procedure Duration: 0 hours 16 minutes 9 seconds  Findings:                 The perianal and digital rectal examinations were                            normal.                           The terminal ileum appeared normal.                           A 3 mm polyp was found in the sigmoid colon. The                            polyp was sessile. The polyp was removed with a                            cold snare. Resection and retrieval were complete.                           A few small-mouthed diverticula were found in the  sigmoid colon.                           Internal hemorrhoids were found during                            retroflexion. The hemorrhoids were large.                           The terminal ileum appeared normal.                           The exam was otherwise without abnormality. Complications:            No immediate complications. Estimated blood loss:                            Minimal. Estimated Blood Loss:     Estimated blood loss was minimal. Impression:               - The examined portion of the ileum was normal.                           - One 3 mm polyp in the sigmoid colon, removed with                            a cold snare. Resected and retrieved.                           - Diverticulosis in the sigmoid colon.                           - Internal hemorrhoids.                           - The examined portion of the ileum was  normal.                           - The examination was otherwise normal.                           No concerning pathology to cause heme positive                            stools, large hemorrhoids could be related. Recommendation:           - Patient has a contact number available for                            emergencies. The signs and symptoms of potential                            delayed complications were discussed with the                            patient. Return to normal activities tomorrow.  Written discharge instructions were provided to the                            patient.                           - Resume previous diet.                           - Continue present medications.                           - Await pathology results. Remo Lipps P. Armbruster, MD 05/24/2019 3:08:30 PM This report has been signed electronically.

## 2019-05-26 ENCOUNTER — Telehealth: Payer: Self-pay

## 2019-05-26 NOTE — Telephone Encounter (Signed)
  Follow up Call-  Call back number 05/24/2019  Post procedure Call Back phone  # [phone]  Permission to leave phone message Yes  Some recent data might be hidden     Patient questions:  Do you have a fever, pain , or abdominal swelling? No. Pain Score  0 *  Have you tolerated food without any problems? Yes.    Have you been able to return to your normal activities? Yes.    Do you have any questions about your discharge instructions: Diet   No. Medications  No. Follow up visit  No.  Do you have questions or concerns about your Care? No.  Actions: * If pain score is 4 or above: No action needed, pain <4.  1. Have you developed a fever since your procedure? no  2.   Have you had an respiratory symptoms (SOB or cough) since your procedure? no  3.   Have you tested positive for COVID 19 since your procedure no  4.   Have you had any family members/close contacts diagnosed with the COVID 19 since your procedure?  no   If yes to any of these questions please route to Joylene John, RN and Alphonsa Gin, Therapist, sports.

## 2019-05-31 ENCOUNTER — Other Ambulatory Visit: Payer: Self-pay

## 2019-05-31 MED ORDER — OMEPRAZOLE 40 MG PO CPDR: 40.0000 mg | DELAYED_RELEASE_CAPSULE | Freq: Two times a day (BID) | ORAL | 0 refills | Status: DC

## 2019-05-31 MED ORDER — PYLERA 140-125-125 MG PO CAPS: 3.0000 | ORAL_CAPSULE | Freq: Three times a day (TID) | ORAL | 0 refills | Status: DC

## 2019-07-14 ENCOUNTER — Other Ambulatory Visit: Payer: Self-pay

## 2019-07-14 ENCOUNTER — Telehealth: Payer: Self-pay

## 2019-07-14 DIAGNOSIS — A048 Other specified bacterial intestinal infections: Secondary | ICD-10-CM

## 2019-07-14 NOTE — Telephone Encounter (Signed)
-----   Message from Hughie Closs, RN sent at 05/31/2019  4:34 PM EST ----- Put order in Epic and call pt. To get repeat stool test for H-pylori = 07/15/19

## 2019-07-14 NOTE — Telephone Encounter (Signed)
Called and left message on voice mail for  patient to please come into our lab in the basement and have a repeat stool test for H pylori. Asked him to call in any questions

## 2020-02-11 ENCOUNTER — Encounter: Payer: Self-pay | Admitting: Cardiovascular Disease

## 2020-02-11 ENCOUNTER — Ambulatory Visit: Payer: Medicare HMO | Admitting: Cardiovascular Disease

## 2020-02-11 ENCOUNTER — Other Ambulatory Visit: Payer: Self-pay

## 2020-02-11 VITALS — BP 180/75 | HR 66 | Ht 68.0 in | Wt 254.4 lb

## 2020-02-11 DIAGNOSIS — I350 Nonrheumatic aortic (valve) stenosis: Secondary | ICD-10-CM

## 2020-02-11 DIAGNOSIS — R001 Bradycardia, unspecified: Secondary | ICD-10-CM

## 2020-02-11 DIAGNOSIS — E7800 Pure hypercholesterolemia, unspecified: Secondary | ICD-10-CM

## 2020-02-11 DIAGNOSIS — I10 Essential (primary) hypertension: Secondary | ICD-10-CM | POA: Diagnosis not present
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
## 2020-02-11 NOTE — Patient Instructions (Signed)
Medication Instructions:  No changes *If you need a refill on your cardiac medications before your next appointment, please call your pharmacy*   Lab Work: None ordered If you have labs (blood work) drawn today and your tests are completely normal, you will receive your results only by: MyChart Message (if you have MyChart) OR A paper copy in the mail If you have any lab test that is abnormal or we need to change your treatment, we will call you to review the results.   Testing/Procedures: Your physician has requested that you have an echocardiogram. Echocardiography is a painless test that uses sound waves to create images of your heart. It provides your doctor with information about the size and shape of your heart and how well your heart's chambers and valves are working. You may receive an ultrasound enhancing agent through an IV if needed to better visualize your heart during the echo.This procedure takes approximately one hour. There are no restrictions for this procedure. This will take place at the [address], [address].    Follow-Up: At [facility], you and your health needs are our priority.  As part of our continuing mission to provide you with exceptional heart care, we have created designated Provider Care Teams.  These Care Teams include your primary Cardiologist (physician) and Advanced Practice Providers (APPs -  Physician Assistants and Nurse Practitioners) who all work together to provide you with the care you need, when you need it.  We recommend signing up for the patient portal called "MyChart".  Sign up information is provided on this After Visit Summary.  MyChart is used to connect with patients for Virtual Visits (Telemedicine).  Patients are able to view lab/test results, encounter notes, upcoming appointments, etc.  Non-urgent messages can be sent to your provider as well.   To learn more about what you can do with MyChart, go to [email or web address].     Your next appointment:   12 month(s)  The format for your next appointment:   In Person  Provider:   You may see Mihai Croitoru, MD or one of the following Advanced Practice Providers on your designated Care Team:   Hao Meng, PA-C Angela Duke, PA-C or  Krista Kroeger, PA-C   

## 2020-02-13 ENCOUNTER — Encounter: Payer: Self-pay | Admitting: Cardiovascular Disease

## 2020-03-03 ENCOUNTER — Other Ambulatory Visit: Payer: Self-pay

## 2020-03-03 ENCOUNTER — Ambulatory Visit (HOSPITAL_COMMUNITY): Payer: Medicare HMO | Attending: Cardiovascular Disease

## 2020-03-03 DIAGNOSIS — I350 Nonrheumatic aortic (valve) stenosis: Secondary | ICD-10-CM | POA: Diagnosis not present

## 2020-03-03 LAB — ECHOCARDIOGRAM COMPLETE
AR max vel: 0.72 cm2
AV Area VTI: 0.78 cm2
AV Area mean vel: 0.75 cm2
AV Mean grad: 32.4 mmHg
AV Peak grad: 65.1 mmHg
Ao pk vel: 4.03 m/s
Area-P 1/2: 2.63 cm2
P 1/2 time: 709 msec
S' Lateral: 2.9 cm

## 2020-07-09 ENCOUNTER — Emergency Department (HOSPITAL_COMMUNITY): Admission: EM | Admit: 2020-07-09 | Discharge: 2020-07-09 | Discharge status: Absent without leave | Payer: Medicare HMO

## 2020-07-09 ENCOUNTER — Emergency Department (HOSPITAL_BASED_OUTPATIENT_CLINIC_OR_DEPARTMENT_OTHER)
Admission: EM | Admit: 2020-07-09 | Discharge: 2020-07-09 | Disposition: A | Discharge status: Home / Self Care | Payer: Medicare HMO | Attending: Emergency Medicine | Admitting: Emergency Medicine

## 2020-07-09 ENCOUNTER — Other Ambulatory Visit: Payer: Self-pay

## 2020-07-09 ENCOUNTER — Encounter (HOSPITAL_BASED_OUTPATIENT_CLINIC_OR_DEPARTMENT_OTHER): Payer: Self-pay | Admitting: Emergency Medicine

## 2020-07-09 ENCOUNTER — Emergency Department (HOSPITAL_BASED_OUTPATIENT_CLINIC_OR_DEPARTMENT_OTHER): Payer: Medicare HMO

## 2020-07-09 DIAGNOSIS — Z87891 Personal history of nicotine dependence: Secondary | ICD-10-CM | POA: Diagnosis not present

## 2020-07-09 DIAGNOSIS — L03317 Cellulitis of buttock: Secondary | ICD-10-CM | POA: Diagnosis not present

## 2020-07-09 DIAGNOSIS — M7989 Other specified soft tissue disorders: Secondary | ICD-10-CM | POA: Diagnosis not present

## 2020-07-09 DIAGNOSIS — Z79899 Other long term (current) drug therapy: Secondary | ICD-10-CM | POA: Insufficient documentation

## 2020-07-09 DIAGNOSIS — N400 Enlarged prostate without lower urinary tract symptoms: Secondary | ICD-10-CM | POA: Diagnosis not present

## 2020-07-09 DIAGNOSIS — E1136 Type 2 diabetes mellitus with diabetic cataract: Secondary | ICD-10-CM | POA: Insufficient documentation

## 2020-07-09 DIAGNOSIS — D72829 Elevated white blood cell count, unspecified: Secondary | ICD-10-CM | POA: Diagnosis not present

## 2020-07-09 DIAGNOSIS — I10 Essential (primary) hypertension: Secondary | ICD-10-CM | POA: Diagnosis not present

## 2020-07-09 DIAGNOSIS — N3289 Other specified disorders of bladder: Secondary | ICD-10-CM | POA: Diagnosis not present

## 2020-07-09 DIAGNOSIS — L0231 Cutaneous abscess of buttock: Secondary | ICD-10-CM | POA: Diagnosis not present

## 2020-07-09 LAB — CBC WITH DIFFERENTIAL/PLATELET
Abs Immature Granulocytes: 0.07 10*3/uL (ref 0.00–0.07)
Basophils Absolute: 0 10*3/uL (ref 0.0–0.1)
Basophils Relative: 0 %
Eosinophils Absolute: 0 10*3/uL (ref 0.0–0.5)
Eosinophils Relative: 0 %
HCT: 42.8 % (ref 39.0–52.0)
Hemoglobin: 15.1 g/dL (ref 13.0–17.0)
Immature Granulocytes: 1 %
Lymphocytes Relative: 9 %
Lymphs Abs: 1.3 10*3/uL (ref 0.7–4.0)
MCH: 32.5 pg (ref 26.0–34.0)
MCHC: 35.3 g/dL (ref 30.0–36.0)
MCV: 92 fL (ref 80.0–100.0)
Monocytes Absolute: 1.1 10*3/uL — ABNORMAL HIGH (ref 0.1–1.0)
Monocytes Relative: 8 %
Neutro Abs: 11.6 10*3/uL — ABNORMAL HIGH (ref 1.7–7.7)
Neutrophils Relative %: 82 %
Platelets: 223 10*3/uL (ref 150–400)
RBC: 4.65 MIL/uL (ref 4.22–5.81)
RDW: 12.5 % (ref 11.5–15.5)
WBC: 14.1 10*3/uL — ABNORMAL HIGH (ref 4.0–10.5)
nRBC: 0 % (ref 0.0–0.2)

## 2020-07-09 LAB — BASIC METABOLIC PANEL
Anion gap: 9 (ref 5–15)
BUN: 12 mg/dL (ref 8–23)
CO2: 27 mmol/L (ref 22–32)
Calcium: 9.1 mg/dL (ref 8.9–10.3)
Chloride: 99 mmol/L (ref 98–111)
Creatinine, Ser: 1.12 mg/dL (ref 0.61–1.24)
GFR, Estimated: >60 mL/min (ref >60)
Glucose, Bld: 132 mg/dL — ABNORMAL HIGH (ref 70–99)
Potassium: 3.1 mmol/L — ABNORMAL LOW (ref 3.5–5.1)
Sodium: 135 mmol/L (ref 135–145)

## 2020-07-09 LAB — LACTIC ACID, PLASMA: Lactic Acid, Venous: 1.8 mmol/L (ref 0.5–1.9)

## 2020-07-09 MED ORDER — IOHEXOL 300 MG/ML  SOLN
100.0000 mL | Freq: Once | INTRAMUSCULAR | Status: AC
  Administered 2020-07-09: 100 mL via INTRAVENOUS

## 2020-07-09 MED ORDER — DOXYCYCLINE HYCLATE 100 MG PO CAPS: 100.0000 mg | ORAL_CAPSULE | Freq: Two times a day (BID) | ORAL | 0 refills | Status: AC

## 2020-07-09 MED ORDER — ACETAMINOPHEN 325 MG PO TABS
650.0000 mg | ORAL_TABLET | Freq: Once | ORAL | Status: AC
  Administered 2020-07-09: 650 mg via ORAL
  Filled 2020-07-09: qty 2

## 2020-07-09 MED ORDER — POTASSIUM CHLORIDE CRYS ER 20 MEQ PO TBCR
40.0000 meq | EXTENDED_RELEASE_TABLET | Freq: Once | ORAL | Status: AC
  Administered 2020-07-09: 40 meq via ORAL
  Filled 2020-07-09: qty 2

## 2020-07-09 MED ORDER — DOXYCYCLINE HYCLATE 100 MG PO TABS
100.0000 mg | ORAL_TABLET | Freq: Once | ORAL | Status: AC
  Administered 2020-07-09: 100 mg via ORAL
  Filled 2020-07-09: qty 1

## 2020-07-09 NOTE — ED Triage Notes (Signed)
Pt reports an abscess to L buttock that is draining.
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
## 2020-07-09 NOTE — ED Notes (Signed)
ED Provider at bedside. 

## 2020-07-09 NOTE — ED Notes (Signed)
NS FLUID BOLUS INITIATED, NS @ 999ML/HR

## 2020-07-09 NOTE — Discharge Instructions (Signed)
You have been diagnosed with cellulitis, soft tissue infection.  Please read the attachment.  You had a fever here in the ER and you have a large infection.  While I feel as though it is reasonable for management at home with antibiotics and fever control, please have a very low threshold to return to the ED should you experience any new or worsening symptoms.  If your symptoms fail to improve, you will need to return to the ED for IV antibiotics and continued management.  Follow-up with your primary care provider in the next couple of days for reevaluation.  Your potassium was also low here in the ED, you will need laboratory recheck.

## 2020-07-13 DIAGNOSIS — L0231 Cutaneous abscess of buttock: Secondary | ICD-10-CM | POA: Diagnosis not present

## 2020-07-13 DIAGNOSIS — L03317 Cellulitis of buttock: Secondary | ICD-10-CM | POA: Diagnosis not present

## 2020-07-13 DIAGNOSIS — S31829A Unspecified open wound of left buttock, initial encounter: Secondary | ICD-10-CM | POA: Diagnosis not present

## 2020-07-14 LAB — CULTURE, BLOOD (ROUTINE X 2)
Culture: NO GROWTH
Culture: NO GROWTH
Special Requests: ADEQUATE

## 2020-12-20 DIAGNOSIS — H401133 Primary open-angle glaucoma, bilateral, severe stage: Secondary | ICD-10-CM | POA: Diagnosis not present

## 2021-02-16 ENCOUNTER — Encounter: Payer: Self-pay | Admitting: Cardiovascular Disease

## 2021-02-16 ENCOUNTER — Other Ambulatory Visit: Payer: Self-pay

## 2021-02-16 ENCOUNTER — Ambulatory Visit: Payer: Medicare HMO | Admitting: Cardiovascular Disease

## 2021-02-16 VITALS — BP 196/80 | HR 53 | Ht 67.5 in | Wt 255.2 lb

## 2021-02-16 DIAGNOSIS — R001 Bradycardia, unspecified: Secondary | ICD-10-CM | POA: Diagnosis not present

## 2021-02-16 DIAGNOSIS — I350 Nonrheumatic aortic (valve) stenosis: Secondary | ICD-10-CM | POA: Diagnosis not present

## 2021-02-16 DIAGNOSIS — I10 Essential (primary) hypertension: Secondary | ICD-10-CM

## 2021-02-16 DIAGNOSIS — E7800 Pure hypercholesterolemia, unspecified: Secondary | ICD-10-CM

## 2021-02-16 NOTE — Patient Instructions (Signed)
Medication Instructions:  No changes *If you need a refill on your cardiac medications before your next appointment, please call your pharmacy*   Lab Work: None ordered If you have labs (blood work) drawn today and your tests are completely normal, you will receive your results only by: [place] (if you have MyChart) OR A paper copy in the mail If you have any lab test that is abnormal or we need to change your treatment, we will call you to review the results.   Testing/Procedures: Your physician has requested that you have an echocardiogram. Echocardiography is a painless test that uses sound waves to create images of your heart. It provides your doctor with information about the size and shape of your heart and how well your heart's chambers and valves are working. You may receive an ultrasound enhancing agent through an IV if needed to better visualize your heart during the echo.This procedure takes approximately one hour. There are no restrictions for this procedure. This will take place at the [address], [address].   Follow-Up: At [facility], you and your health needs are our priority.  As part of our continuing mission to provide you with exceptional heart care, we have created designated Provider Care Teams.  These Care Teams include your primary Cardiologist (physician) and Advanced Practice Providers (APPs -  Physician Assistants and Nurse Practitioners) who all work together to provide you with the care you need, when you need it.  We recommend signing up for the patient portal called "MyChart".  Sign up information is provided on this After Visit Summary.  MyChart is used to connect with patients for Virtual Visits (Telemedicine).  Patients are able to view lab/test results, encounter notes, upcoming appointments, etc.  Non-urgent messages can be sent to your provider as well.   To learn more about what you can do with MyChart, go to [email or web address].     Your next appointment:   6 month(s)  The format for your next appointment:   In Person  Provider:   Sanda Klein, MD
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
## 2021-02-23 NOTE — Addendum Note (Signed)
Addended by: Orma Render on: 02/23/2021 09:38 AM   Modules accepted: Orders

## 2021-03-09 ENCOUNTER — Ambulatory Visit (HOSPITAL_COMMUNITY): Payer: Medicare HMO | Attending: Cardiovascular Disease

## 2021-03-09 ENCOUNTER — Other Ambulatory Visit: Payer: Self-pay

## 2021-03-09 DIAGNOSIS — I350 Nonrheumatic aortic (valve) stenosis: Secondary | ICD-10-CM | POA: Diagnosis not present

## 2021-03-09 LAB — ECHOCARDIOGRAM COMPLETE
AR max vel: 0.95 cm2
AV Area VTI: 1.06 cm2
AV Area mean vel: 0.81 cm2
AV Mean grad: 37 mmHg
AV Peak grad: 70.9 mmHg
Ao pk vel: 4.21 m/s
Area-P 1/2: 2.16 cm2
P 1/2 time: 854 msec
S' Lateral: 3.2 cm
Single Plane A4C EF: 61.6 %

## 2021-03-16 ENCOUNTER — Other Ambulatory Visit: Payer: Self-pay | Admitting: *Deleted

## 2021-03-16 DIAGNOSIS — I350 Nonrheumatic aortic (valve) stenosis: Secondary | ICD-10-CM

## 2021-03-23 ENCOUNTER — Telehealth: Payer: Self-pay

## 2021-03-23 NOTE — Telephone Encounter (Signed)
Attempted to reach the pt at mobile number but his voicemail is full.  Left message at home number for pt to contact me in regards to scheduling appointment for TAVR Evaluation.

## 2021-03-27 ENCOUNTER — Ambulatory Visit: Payer: Medicare HMO | Admitting: Cardiovascular Disease

## 2021-03-27 ENCOUNTER — Other Ambulatory Visit: Payer: Self-pay

## 2021-03-27 ENCOUNTER — Encounter: Payer: Self-pay | Admitting: Cardiovascular Disease

## 2021-03-27 VITALS — BP 150/70 | HR 54 | Ht 67.5 in | Wt 253.8 lb

## 2021-03-27 DIAGNOSIS — I350 Nonrheumatic aortic (valve) stenosis: Secondary | ICD-10-CM | POA: Diagnosis not present
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
## 2021-03-27 NOTE — Addendum Note (Signed)
Addended by: Harland German A on: 03/27/2021 03:24 PM   Modules accepted: Orders

## 2021-03-27 NOTE — Patient Instructions (Addendum)
Medication Instructions:  Your physician recommends that you continue on your current medications as directed. Please refer to the Current Medication list given to you today.  *If you need a refill on your cardiac medications before your next appointment, please call your pharmacy*   Lab Work: Your physician recommends that you return for lab work on 04/04/21  If you have labs (blood work) drawn today and your tests are completely normal, you will receive your results only by: [place] (if you have MyChart) OR A paper copy in the mail If you have any lab test that is abnormal or we need to change your treatment, we will call you to review the results.   Testing/Procedures: Your physician has requested that you have a cardiac catheterization. Cardiac catheterization is used to diagnose and/or treat various heart conditions. Doctors may recommend this procedure for a number of different reasons. The most common reason is to evaluate chest pain. Chest pain can be a symptom of coronary artery disease (CAD), and cardiac catheterization can show whether plaque is narrowing or blocking your heart's arteries. This procedure is also used to evaluate the valves, as well as measure the blood flow and oxygen levels in different parts of your heart. For further information please visit [employer]. Please follow instruction sheet, as given.   Follow-Up: Theodosia Quay, RN will contact you with follow up appointments    Other [place] OFFICE [place], [place] [address] Dept: [phone] Loc: [place].  03/27/2021  You are scheduled for a Cardiac Catheterization on Friday, September 9 with Dr. Lauree Chandler.  1. Please arrive at the [facility] (Main Entrance A) at [facility]: [address] [place], [place] [address] at 7:00 AM (This  time is two hours before your procedure to ensure your preparation). Free valet parking service is available.   Special note: Every effort is made to have your procedure done on time. Please understand that emergencies sometimes delay scheduled procedures.  2. Diet: Do not eat solid foods after midnight.  The patient may have clear liquids until 5am upon the day of the procedure.  3. Medication instructions in preparation for your procedure:  Hold Losartan the morning of your procedure   On the morning of your procedure, take your Aspirin and any morning medicines NOT listed above.  You may use sips of water.  5. Plan for one night stay--bring personal belongings. 6. Bring a current list of your medications and current insurance cards. 7. You MUST have a responsible person to drive you home. 8. Someone MUST be with you the first 24 hours after you arrive home or your discharge will be delayed. 9. Please wear clothes that are easy to get on and off and wear slip-on shoes.  Thank you for allowing [place] to care for you!   -- [facility]

## 2021-03-30 ENCOUNTER — Other Ambulatory Visit: Payer: Medicare HMO | Admitting: *Deleted

## 2021-03-30 ENCOUNTER — Other Ambulatory Visit: Payer: Self-pay

## 2021-03-30 DIAGNOSIS — I350 Nonrheumatic aortic (valve) stenosis: Secondary | ICD-10-CM

## 2021-03-31 LAB — BASIC METABOLIC PANEL
BUN/Creatinine Ratio: 10 (ref 10–24)
BUN: 11 mg/dL (ref 8–27)
CO2: 24 mmol/L (ref 20–29)
Calcium: 9.4 mg/dL (ref 8.6–10.2)
Chloride: 103 mmol/L (ref 96–106)
Creatinine, Ser: 1.12 mg/dL (ref 0.76–1.27)
Glucose: 99 mg/dL (ref 65–99)
Potassium: 3.7 mmol/L (ref 3.5–5.2)
Sodium: 142 mmol/L (ref 134–144)
eGFR: 69 mL/min/{1.73_m2} (ref >59)

## 2021-03-31 LAB — CBC
Hematocrit: 45.3 % (ref 37.5–51.0)
Hemoglobin: 15.4 g/dL (ref 13.0–17.7)
MCH: 31.8 pg (ref 26.6–33.0)
MCHC: 34 g/dL (ref 31.5–35.7)
MCV: 94 fL (ref 79–97)
Platelets: 168 10*3/uL (ref 150–450)
RBC: 4.84 x10E6/uL (ref 4.14–5.80)
RDW: 13.2 % (ref 11.6–15.4)
WBC: 4.9 10*3/uL (ref 3.4–10.8)

## 2021-04-04 ENCOUNTER — Other Ambulatory Visit: Payer: Medicare HMO

## 2021-04-04 DIAGNOSIS — H401133 Primary open-angle glaucoma, bilateral, severe stage: Secondary | ICD-10-CM | POA: Diagnosis not present

## 2021-04-05 ENCOUNTER — Telehealth: Payer: Self-pay | Admitting: *Deleted

## 2021-04-05 NOTE — Telephone Encounter (Addendum)
Cardiac catheterization scheduled at [facility] for: Friday April 06, 2021 [facility] Main Entrance A [facility]) at: 7 AM   No solid food after midnight prior to cath, clear liquids until 5 AM day of procedure.  Medication instructions: Hold: Losartan-HCT/KCl-AM of procedure  Except hold medications morning medications can be taken pre-cath with sips of water including aspirin 81 mg.    Confirmed patient has responsible adult to drive home post procedure and be with patient first 24 hours after arriving home.  Patients are allowed one visitor in the waiting room during the time they are at the hospital for their procedure. Both patient and visitor must wear a mask once they enter the hospital.   Patient reports does not currently have any symptoms concerning for COVID-19 and no household members with COVID-19 like illness.      Reviewed procedure/mask/visitor instructions with patient.

## 2021-04-06 ENCOUNTER — Ambulatory Visit (HOSPITAL_COMMUNITY)
Admission: RE | Admit: 2021-04-06 | Discharge: 2021-04-06 | Disposition: A | Discharge status: Home / Self Care | Payer: Medicare HMO | Source: Ambulatory Visit | Attending: Cardiovascular Disease | Admitting: Cardiovascular Disease

## 2021-04-06 ENCOUNTER — Other Ambulatory Visit: Payer: Self-pay

## 2021-04-06 ENCOUNTER — Ambulatory Visit (HOSPITAL_COMMUNITY)
Admission: RE | Disposition: A | Discharge status: Home / Self Care | Payer: Self-pay | Source: Ambulatory Visit | Attending: Cardiovascular Disease

## 2021-04-06 DIAGNOSIS — I10 Essential (primary) hypertension: Secondary | ICD-10-CM | POA: Diagnosis not present

## 2021-04-06 DIAGNOSIS — E119 Type 2 diabetes mellitus without complications: Secondary | ICD-10-CM | POA: Insufficient documentation

## 2021-04-06 DIAGNOSIS — Z79899 Other long term (current) drug therapy: Secondary | ICD-10-CM | POA: Insufficient documentation

## 2021-04-06 DIAGNOSIS — Z87891 Personal history of nicotine dependence: Secondary | ICD-10-CM | POA: Insufficient documentation

## 2021-04-06 DIAGNOSIS — E669 Obesity, unspecified: Secondary | ICD-10-CM | POA: Diagnosis not present

## 2021-04-06 DIAGNOSIS — E785 Hyperlipidemia, unspecified: Secondary | ICD-10-CM | POA: Diagnosis not present

## 2021-04-06 DIAGNOSIS — I2510 Atherosclerotic heart disease of native coronary artery without angina pectoris: Secondary | ICD-10-CM | POA: Insufficient documentation

## 2021-04-06 DIAGNOSIS — Z6839 Body mass index (BMI) 39.0-39.9, adult: Secondary | ICD-10-CM | POA: Insufficient documentation

## 2021-04-06 DIAGNOSIS — Z880 Allergy status to penicillin: Secondary | ICD-10-CM | POA: Diagnosis not present

## 2021-04-06 DIAGNOSIS — I350 Nonrheumatic aortic (valve) stenosis: Secondary | ICD-10-CM | POA: Diagnosis not present

## 2021-04-06 HISTORY — PX: RIGHT/LEFT HEART CATH AND CORONARY ANGIOGRAPHY: CATH118266

## 2021-04-06 LAB — POCT I-STAT EG7
Acid-Base Excess: 0 mmol/L (ref 0.0–2.0)
Acid-Base Excess: 1 mmol/L (ref 0.0–2.0)
Bicarbonate: 26 mmol/L (ref 20.0–28.0)
Bicarbonate: 26.5 mmol/L (ref 20.0–28.0)
Calcium, Ion: 1.21 mmol/L (ref 1.15–1.40)
Calcium, Ion: 1.24 mmol/L (ref 1.15–1.40)
HCT: 42 % (ref 39.0–52.0)
HCT: 43 % (ref 39.0–52.0)
Hemoglobin: 14.3 g/dL (ref 13.0–17.0)
Hemoglobin: 14.6 g/dL (ref 13.0–17.0)
O2 Saturation: 82 %
O2 Saturation: 85 %
Potassium: 3.4 mmol/L — ABNORMAL LOW (ref 3.5–5.1)
Potassium: 3.5 mmol/L (ref 3.5–5.1)
Sodium: 142 mmol/L (ref 135–145)
Sodium: 143 mmol/L (ref 135–145)
TCO2: 27 mmol/L (ref 22–32)
TCO2: 28 mmol/L (ref 22–32)
pCO2, Ven: 44.8 mmHg (ref 44.0–60.0)
pCO2, Ven: 44.9 mmHg (ref 44.0–60.0)
pH, Ven: 7.372 (ref 7.250–7.430)
pH, Ven: 7.379 (ref 7.250–7.430)
pO2, Ven: 48 mmHg — ABNORMAL HIGH (ref 32.0–45.0)
pO2, Ven: 51 mmHg — ABNORMAL HIGH (ref 32.0–45.0)

## 2021-04-06 LAB — POCT I-STAT 7, (LYTES, BLD GAS, ICA,H+H)
Acid-base deficit: 1 mmol/L (ref 0.0–2.0)
Bicarbonate: 24.4 mmol/L (ref 20.0–28.0)
Calcium, Ion: 1.1 mmol/L — ABNORMAL LOW (ref 1.15–1.40)
HCT: 40 % (ref 39.0–52.0)
Hemoglobin: 13.6 g/dL (ref 13.0–17.0)
O2 Saturation: 99 %
Potassium: 3.1 mmol/L — ABNORMAL LOW (ref 3.5–5.1)
Sodium: 145 mmol/L (ref 135–145)
TCO2: 26 mmol/L (ref 22–32)
pCO2 arterial: 40.9 mmHg (ref 32.0–48.0)
pH, Arterial: 7.384 (ref 7.350–7.450)
pO2, Arterial: 163 mmHg — ABNORMAL HIGH (ref 83.0–108.0)

## 2021-04-06 LAB — GLUCOSE, CAPILLARY: Glucose-Capillary: 108 mg/dL — ABNORMAL HIGH (ref 70–99)

## 2021-04-06 SURGERY — RIGHT/LEFT HEART CATH AND CORONARY ANGIOGRAPHY
Anesthesia: LOCAL

## 2021-04-06 MED ORDER — SODIUM CHLORIDE 0.9% FLUSH: 3.0000 mL | Freq: Two times a day (BID) | INTRAVENOUS | Status: DC

## 2021-04-06 MED ORDER — LABETALOL HCL 5 MG/ML IV SOLN: 10.0000 mg | INTRAVENOUS | Status: DC | PRN

## 2021-04-06 MED ORDER — FENTANYL CITRATE (PF) 100 MCG/2ML IJ SOLN
INTRAMUSCULAR | Status: AC
  Filled 2021-04-06: qty 2

## 2021-04-06 MED ORDER — HYDRALAZINE HCL 20 MG/ML IJ SOLN
10.0000 mg | INTRAMUSCULAR | Status: DC | PRN
  Administered 2021-04-06 (×2): 10 mg via INTRAVENOUS
  Filled 2021-04-06: qty 1

## 2021-04-06 MED ORDER — HEPARIN SODIUM (PORCINE) 1000 UNIT/ML IJ SOLN
INTRAMUSCULAR | Status: DC | PRN
  Administered 2021-04-06: 5000 [IU] via INTRAVENOUS

## 2021-04-06 MED ORDER — MIDAZOLAM HCL 2 MG/2ML IJ SOLN
INTRAMUSCULAR | Status: DC | PRN
  Administered 2021-04-06: 1 mg via INTRAVENOUS

## 2021-04-06 MED ORDER — ONDANSETRON HCL 4 MG/2ML IJ SOLN: 4.0000 mg | Freq: Four times a day (QID) | INTRAMUSCULAR | Status: DC | PRN

## 2021-04-06 MED ORDER — SODIUM CHLORIDE 0.9 % IV SOLN: INTRAVENOUS | Status: AC

## 2021-04-06 MED ORDER — MIDAZOLAM HCL 2 MG/2ML IJ SOLN
INTRAMUSCULAR | Status: AC
  Filled 2021-04-06: qty 2

## 2021-04-06 MED ORDER — SODIUM CHLORIDE 0.9 % IV SOLN: 250.0000 mL | INTRAVENOUS | Status: DC | PRN

## 2021-04-06 MED ORDER — HEPARIN SODIUM (PORCINE) 1000 UNIT/ML IJ SOLN
INTRAMUSCULAR | Status: AC
  Filled 2021-04-06: qty 1

## 2021-04-06 MED ORDER — ASPIRIN 81 MG PO CHEW: 81.0000 mg | CHEWABLE_TABLET | ORAL | Status: DC

## 2021-04-06 MED ORDER — SODIUM CHLORIDE 0.9 % WEIGHT BASED INFUSION
3.0000 mL/kg/h | INTRAVENOUS | Status: AC
  Administered 2021-04-06: 3 mL/kg/h via INTRAVENOUS

## 2021-04-06 MED ORDER — ACETAMINOPHEN 325 MG PO TABS: 650.0000 mg | ORAL_TABLET | ORAL | Status: DC | PRN

## 2021-04-06 MED ORDER — SODIUM CHLORIDE 0.9 % WEIGHT BASED INFUSION: 1.0000 mL/kg/h | INTRAVENOUS | Status: DC

## 2021-04-06 MED ORDER — SODIUM CHLORIDE 0.9% FLUSH: 3.0000 mL | INTRAVENOUS | Status: DC | PRN

## 2021-04-06 MED ORDER — HEPARIN (PORCINE) IN NACL 1000-0.9 UT/500ML-% IV SOLN
INTRAVENOUS | Status: AC
  Filled 2021-04-06: qty 500

## 2021-04-06 MED ORDER — VERAPAMIL HCL 2.5 MG/ML IV SOLN
INTRAVENOUS | Status: AC
  Filled 2021-04-06: qty 2

## 2021-04-06 MED ORDER — FENTANYL CITRATE (PF) 100 MCG/2ML IJ SOLN
INTRAMUSCULAR | Status: DC | PRN
  Administered 2021-04-06: 25 ug via INTRAVENOUS

## 2021-04-06 MED ORDER — VERAPAMIL HCL 2.5 MG/ML IV SOLN
INTRAVENOUS | Status: DC | PRN
  Administered 2021-04-06: 10 mL via INTRA_ARTERIAL

## 2021-04-06 MED ORDER — LIDOCAINE HCL (PF) 1 % IJ SOLN
INTRAMUSCULAR | Status: DC | PRN
  Administered 2021-04-06: 5 mL via INTRADERMAL

## 2021-04-06 MED ORDER — LIDOCAINE HCL (PF) 1 % IJ SOLN
INTRAMUSCULAR | Status: AC
  Filled 2021-04-06: qty 30

## 2021-04-06 SURGICAL SUPPLY — 13 items
CATH 5FR JL3.5 JR4 ANG PIG MP (CATHETERS) ×1 IMPLANT
CATH BALLN WEDGE 5F 110CM (CATHETERS) ×1 IMPLANT
DEVICE RAD COMP TR BAND LRG (VASCULAR PRODUCTS) ×1 IMPLANT
GLIDESHEATH SLEND SS 6F .021 (SHEATH) ×1 IMPLANT
GUIDEWIRE .025 260CM (WIRE) ×1 IMPLANT
GUIDEWIRE INQWIRE 1.5J.035X260 (WIRE) IMPLANT
INQWIRE 1.5J .035X260CM (WIRE) ×2
KIT HEART LEFT (KITS) ×2 IMPLANT
PACK CARDIAC CATHETERIZATION (CUSTOM PROCEDURE TRAY) ×2 IMPLANT
SHEATH GLIDE SLENDER 4/5FR (SHEATH) ×1 IMPLANT
SYR MEDRAD MARK 7 150ML (SYRINGE) ×2 IMPLANT
TRANSDUCER W/STOPCOCK (MISCELLANEOUS) ×2 IMPLANT
TUBING CIL FLEX 10 FLL-RA (TUBING) ×2 IMPLANT

## 2021-04-06 NOTE — Interval H&P Note (Signed)
History and Physical Interval Note:  04/06/2021 7:40 AM  Bernard Garcia.  has presented today for surgery, with the diagnosis of aortic stenosis.  The various methods of treatment have been discussed with the patient and family. After consideration of risks, benefits and other options for treatment, the patient has consented to  Procedure(s): RIGHT/LEFT HEART CATH AND CORONARY ANGIOGRAPHY (N/A) as a surgical intervention.  The patient's history has been reviewed, patient examined, no change in status, stable for surgery.  I have reviewed the patient's chart and labs.  Questions were answered to the patient's satisfaction.    Cath Lab Visit (complete for each Cath Lab visit)  Clinical Evaluation Leading to the Procedure:   ACS: No.  Non-ACS:    Anginal Classification: CCS II  Anti-ischemic medical therapy: Minimal Therapy (1 class of medications)  Non-Invasive Test Results: No non-invasive testing performed  Prior CABG: No previous CABG        Lauree Chandler

## 2021-04-06 NOTE — Research (Signed)
Identify Informed Consent   Subject Name: Bernard Garcia.  Subject met inclusion and exclusion criteria.  The informed consent form, study requirements and expectations were reviewed with the subject and questions and concerns were addressed prior to the signing of the consent form.  The subject verbalized understanding of the trial requirements.  The subject agreed to participate in the Identify trial and signed the informed consent at 0800am on 06-Apr-2021.  The informed consent was obtained prior to performance of any protocol-specific procedures for the subject.  A copy of the signed informed consent was given to the subject and a copy was placed in the subject's medical record.   Jasmine Pang, RN BSN [facility] Cardiovascular Research & Education Direct Line: [phone]

## 2021-04-06 NOTE — Progress Notes (Signed)
Pt ambulated without difficulty or bleeding.   Discharged home with his wife who will drive and stay with pt x 24 hrs. 

## 2021-04-06 NOTE — Discharge Instructions (Signed)
Radial Site Care  This sheet gives you information about how to care for yourself after your procedure. Your health care provider may also give you more specific instructions. If you have problems or questions, contact your health care provider. What can I expect after the procedure? After the procedure, it is common to have: Bruising and tenderness at the catheter insertion area. Follow these instructions at home: Medicines Take over-the-counter and prescription medicines only as told by your health care provider. Insertion site care Follow instructions from your health care provider about how to take care of your insertion site. Make sure you: Wash your hands with soap and water before you remove your bandage (dressing). If soap and water are not available, use hand sanitizer. May remove dressing in 24 hours. Check your insertion site every day for signs of infection. Check for: Redness, swelling, or pain. Fluid or blood. Pus or a bad smell. Warmth. Do no take baths, swim, or use a hot tub for 5 days. You may shower 24-48 hours after the procedure. Remove the dressing and gently wash the site with plain soap and water. Pat the area dry with a clean towel. Do not rub the site. That could cause bleeding. Do not apply powder or lotion to the site. Activity  For 24 hours after the procedure, or as directed by your health care provider: Do not flex or bend the affected arm. Do not push or pull heavy objects with the affected arm. Do not drive yourself home from the hospital or clinic. You may drive 24 hours after the procedure. Do not operate machinery or power tools. KEEP ARM ELEVATED THE REMAINDER OF THE DAY. Do not push, pull or lift anything that is heavier than 10 lb for 5 days. Ask your health care provider when it is okay to: Return to work or school. Resume usual physical activities or sports. Resume sexual activity. General instructions If the catheter site starts to  bleed, raise your arm and put firm pressure on the site. If the bleeding does not stop, get help right away. This is a medical emergency. DRINK PLENTY OF FLUIDS FOR THE NEXT 2-3 DAYS. No alcohol consumption for 24 hours after receiving sedation. If you went home on the same day as your procedure, a responsible adult should be with you for the first 24 hours after you arrive home. Keep all follow-up visits as told by your health care provider. This is important. Contact a health care provider if: You have a fever. You have redness, swelling, or yellow drainage around your insertion site. Get help right away if: You have unusual pain at the radial site. The catheter insertion area swells very fast. The insertion area is bleeding, and the bleeding does not stop when you hold steady pressure on the area. Your arm or hand becomes pale, cool, tingly, or numb. These symptoms may represent a serious problem that is an emergency. Do not wait to see if the symptoms will go away. Get medical help right away. Call your local emergency services (911 in the [place] Do not drive yourself to the hospital. Summary After the procedure, it is common to have bruising and tenderness at the site. Follow instructions from your health care provider about how to take care of your radial site wound. Check the wound every day for signs of infection.  This information is not intended to replace advice given to you by your health care provider. Make sure you discuss any questions you have with   your health care provider. Document Revised: 08/20/2017 Document Reviewed: 08/20/2017 Elsevier Patient Education  2020 [employer]  

## 2021-04-09 ENCOUNTER — Encounter (HOSPITAL_COMMUNITY): Payer: Self-pay | Admitting: Cardiovascular Disease

## 2021-04-09 ENCOUNTER — Ambulatory Visit (INDEPENDENT_AMBULATORY_CARE_PROVIDER_SITE_OTHER): Payer: Medicare HMO | Admitting: Dentistry

## 2021-04-09 ENCOUNTER — Other Ambulatory Visit: Payer: Self-pay

## 2021-04-09 DIAGNOSIS — K036 Deposits [accretions] on teeth: Secondary | ICD-10-CM

## 2021-04-09 DIAGNOSIS — I350 Nonrheumatic aortic (valve) stenosis: Secondary | ICD-10-CM | POA: Diagnosis not present

## 2021-04-09 DIAGNOSIS — Z01818 Encounter for other preprocedural examination: Secondary | ICD-10-CM | POA: Diagnosis not present

## 2021-04-09 DIAGNOSIS — K030 Excessive attrition of teeth: Secondary | ICD-10-CM

## 2021-04-09 DIAGNOSIS — Z972 Presence of dental prosthetic device (complete) (partial): Secondary | ICD-10-CM

## 2021-04-09 DIAGNOSIS — F40232 Fear of other medical care: Secondary | ICD-10-CM

## 2021-04-09 DIAGNOSIS — M2632 Excessive spacing of fully erupted teeth: Secondary | ICD-10-CM

## 2021-04-09 DIAGNOSIS — K032 Erosion of teeth: Secondary | ICD-10-CM

## 2021-04-09 DIAGNOSIS — K08109 Complete loss of teeth, unspecified cause, unspecified class: Secondary | ICD-10-CM

## 2021-04-09 DIAGNOSIS — K031 Abrasion of teeth: Secondary | ICD-10-CM

## 2021-04-09 DIAGNOSIS — K06020 Generalized gingival recession, unspecified: Secondary | ICD-10-CM

## 2021-04-09 DIAGNOSIS — K0889 Other specified disorders of teeth and supporting structures: Secondary | ICD-10-CM

## 2021-04-09 MED FILL — Heparin Sod (Porcine)-NaCl IV Soln 1000 Unit/500ML-0.9%: INTRAVENOUS | Qty: 1000 | Status: AC

## 2021-04-09 NOTE — Patient Instructions (Signed)
[facility] Phelan Goers B. Radiah Lubinski, D.M.D. Phone: [phone] Fax: [phone]   It was a pleasure seeing you today!  Please refer to the information below regarding your dental visit with us.  Call us if any questions or concerns come up after you leave.   Thank you for letting us provide care for you.  If there is anything we can do for you, please let us know.    HEART VALVES AND MOUTH CARE   FACTS: If you have any infection in your mouth, it can infect your heart valve. If you heart valve is infected, you will be seriously ill. Infections in the mouth can be SILENT and do not always cause pain. Examples of infections in the mouth are gum disease, dental cavities, and abscesses. Some possible signs of infection are: Bad breath, bleeding gums, or teeth that are sensitive to sweets, hot, and/or cold. There are many other signs as well.   WHAT YOU HAVE TO DO: Brush your teeth after meals and at bedtime.  Spend at least 2 minutes brushing well, especially behind your back teeth and all around your teeth that stand alone.  Brush at the gumline also. Do not go to bed without brushing your teeth and flossing. If your gums bleed when you brush or floss, do NOT stop brushing or flossing.  Bleeding can be a sign of inflammation or irritation from bacteria.  It usually means that your gums need more attention and better cleaning.  If your dentist or Dr. Tamlyn Sides gave you a prescription mouthwash to use, make sure to use it as directed. If you run out of the medication, get a refill at the pharmacy. If you were given any other medications or directions by your dentist, please follow them.  If you did not understand the directions or forget what you were told, please call.  We will be happy to refresh your memory. If you need antibiotics before dental procedures, make sure you take them one hour prior to every dental visit as directed.  Get a dental check-up every 4-6  months in order to keep your mouth healthy, or to find and treat any new infection. You will most likely need your teeth cleaned or gums treated at the same time. If you are not able to come in for your scheduled appointment, call your dentist as soon as possible to reschedule. If you have a problem in between dental visits, call your dentist.   QUESTIONS? Call our office during office hours [phone].    WE ARE A TEAM.  OUR GOAL IS:  HEALTHY MOUTH, HEALTHY HEART   
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
## 2021-04-13 ENCOUNTER — Ambulatory Visit (HOSPITAL_COMMUNITY)
Admission: RE | Admit: 2021-04-13 | Discharge: 2021-04-13 | Disposition: A | Discharge status: Home / Self Care | Payer: Medicare HMO | Source: Ambulatory Visit | Attending: Cardiovascular Disease | Admitting: Cardiovascular Disease

## 2021-04-13 ENCOUNTER — Other Ambulatory Visit: Payer: Self-pay

## 2021-04-13 DIAGNOSIS — I2510 Atherosclerotic heart disease of native coronary artery without angina pectoris: Secondary | ICD-10-CM | POA: Diagnosis not present

## 2021-04-13 DIAGNOSIS — I700 Atherosclerosis of aorta: Secondary | ICD-10-CM | POA: Diagnosis not present

## 2021-04-13 DIAGNOSIS — I350 Nonrheumatic aortic (valve) stenosis: Secondary | ICD-10-CM | POA: Insufficient documentation

## 2021-04-13 DIAGNOSIS — N400 Enlarged prostate without lower urinary tract symptoms: Secondary | ICD-10-CM | POA: Diagnosis not present

## 2021-04-13 MED ORDER — IOHEXOL 350 MG/ML SOLN
100.0000 mL | Freq: Once | INTRAVENOUS | Status: AC | PRN
  Administered 2021-04-13: 100 mL via INTRAVENOUS

## 2021-04-13 NOTE — Progress Notes (Signed)
Carotid duplex has been completed.   Preliminary results in [facility] Proc.   Jinny Blossom Zelpha Messing 04/13/2021 1:11 PM

## 2021-04-18 ENCOUNTER — Other Ambulatory Visit: Payer: Self-pay

## 2021-04-18 DIAGNOSIS — I350 Nonrheumatic aortic (valve) stenosis: Secondary | ICD-10-CM

## 2021-04-18 MED ORDER — METOPROLOL TARTRATE 25 MG PO TABS: ORAL_TABLET | ORAL | 0 refills | Status: DC

## 2021-04-18 NOTE — Progress Notes (Signed)
TAVR Coronary CT order placed.

## 2021-04-25 ENCOUNTER — Other Ambulatory Visit: Payer: Self-pay

## 2021-04-25 ENCOUNTER — Encounter (HOSPITAL_COMMUNITY): Payer: Self-pay

## 2021-04-25 ENCOUNTER — Ambulatory Visit (HOSPITAL_COMMUNITY)
Admission: RE | Admit: 2021-04-25 | Discharge: 2021-04-25 | Disposition: A | Discharge status: Home / Self Care | Payer: Medicare HMO | Source: Ambulatory Visit | Attending: Cardiovascular Disease | Admitting: Cardiovascular Disease

## 2021-04-25 DIAGNOSIS — I350 Nonrheumatic aortic (valve) stenosis: Secondary | ICD-10-CM | POA: Insufficient documentation

## 2021-04-25 MED ORDER — IOHEXOL 350 MG/ML SOLN
80.0000 mL | Freq: Once | INTRAVENOUS | Status: AC | PRN
  Administered 2021-04-25: 80 mL via INTRAVENOUS

## 2021-04-27 ENCOUNTER — Telehealth: Payer: Self-pay | Admitting: Physician Assistant

## 2021-04-27 NOTE — Telephone Encounter (Signed)
Entered in error

## 2021-05-16 ENCOUNTER — Institutional Professional Consult (permissible substitution): Payer: Medicare HMO | Admitting: Surgery

## 2021-05-16 ENCOUNTER — Other Ambulatory Visit: Payer: Self-pay

## 2021-05-16 ENCOUNTER — Encounter: Payer: Self-pay | Admitting: Surgery

## 2021-05-16 ENCOUNTER — Ambulatory Visit: Payer: Medicare HMO | Attending: Cardiovascular Disease

## 2021-05-16 VITALS — BP 190/80 | HR 60 | Resp 20 | Ht 67.5 in | Wt 253.0 lb

## 2021-05-16 DIAGNOSIS — I350 Nonrheumatic aortic (valve) stenosis: Secondary | ICD-10-CM | POA: Diagnosis not present

## 2021-05-16 DIAGNOSIS — R262 Difficulty in walking, not elsewhere classified: Secondary | ICD-10-CM | POA: Diagnosis not present

## 2021-05-16 NOTE — Progress Notes (Signed)
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 Eleonore Chiquito M.D. On: 04/26/2021 06:38    Narrative & Impression  CLINICAL DATA:  Nonrheumatic aortic valve stenosis. Pre-TAVR intervention planning.   EXAM: CT ANGIOGRAPHY CHEST, ABDOMEN AND PELVIS   TECHNIQUE: Multidetector CT imaging through the chest, abdomen and pelvis was performed using the standard protocol during bolus administration of intravenous contrast. Multiplanar reconstructed images and MIPs were obtained and  reviewed to evaluate the vascular anatomy.   CONTRAST:  138mL OMNIPAQUE IOHEXOL 350 MG/ML SOLN   COMPARISON:  07/09/2020 CT pelvis.   FINDINGS: CTA CHEST FINDINGS   Cardiovascular: Mild cardiomegaly. Diffuse thickening and coarse calcification of the aortic valve. No significant pericardial effusion/thickening. Three-vessel coronary atherosclerosis. Atherosclerotic nonaneurysmal thoracic aorta. Top-normal caliber main pulmonary artery (3.3 cm diameter). No central pulmonary emboli.   Mediastinum/Nodes: No discrete thyroid nodules. Unremarkable esophagus. No pathologically enlarged axillary, mediastinal or hilar lymph nodes.   Lungs/Pleura: No pneumothorax. No pleural effusion. No acute consolidative airspace disease or lung masses. A few tiny scattered solid pulmonary nodules in both lungs, largest 3 mm in the anterior right lower lobe (series 7/image 54).   Musculoskeletal: No aggressive appearing focal osseous lesions. Moderate thoracic spondylosis.   CTA ABDOMEN AND PELVIS FINDINGS   Hepatobiliary: Normal liver with no liver mass. Normal gallbladder with no radiopaque cholelithiasis. No biliary ductal dilatation.   Pancreas: Normal, with no mass or duct dilation.   Spleen: Normal size. No mass.   Adrenals/Urinary Tract: Symmetric mild lobulated fullness of the bilateral adrenal glands without discrete adrenal nodules, suggesting mild adrenal hyperplasia. No contour deforming renal masses. No hydronephrosis. Normal bladder.   Stomach/Bowel: Normal non-distended stomach. Normal caliber small bowel with no small bowel wall thickening. Normal appendix. Normal large bowel with no diverticulosis, large bowel wall thickening or pericolonic fat stranding.   Vascular/Lymphatic: Mildly atherosclerotic nonaneurysmal abdominal aorta. No pathologically enlarged lymph nodes in the abdomen or pelvis.   Reproductive: Moderate to marked prostatomegaly.   Other: No  pneumoperitoneum, ascites or focal fluid collection.   Musculoskeletal: No aggressive appearing focal osseous lesions. Moderate lumbar spondylosis.   VASCULAR MEASUREMENTS PERTINENT TO TAVR:   AORTA:   Minimal Aortic Diameter-16.1 x 13.7 mm   Severity of Aortic Calcification-mild   RIGHT PELVIS:   Right Common Iliac Artery -   Minimal Diameter-9.5 x 7.5 mm   Tortuosity-mild   Calcification-mild-to-moderate   Right External Iliac Artery -   Minimal Diameter-8.6 x 8.5 mm   Tortuosity-moderate   Calcification-mild   Right Common Femoral Artery -   Minimal Diameter-8.5 x 6.8 mm   Tortuosity-mild   Calcification-moderate   LEFT PELVIS:   Left Common Iliac Artery -   Minimal Diameter-10.0 x 9.8 mm   Tortuosity-mild   Calcification-moderate   Left External Iliac Artery -   Minimal Diameter-8.5 x 7.9 mm   Tortuosity-moderate   Calcification-mild   Left Common Femoral Artery -   Minimal Diameter-8.3 x 6.7 mm   Tortuosity-mild   Calcification-moderate   Review of the MIP images confirms the above findings.   IMPRESSION: 1. Vascular findings and measurements pertinent to potential TAVR procedure, as detailed. 2. Diffuse thickening and coarse calcification of the aortic valve, compatible with the reported history of aortic stenosis. 3. Mild cardiomegaly. Three-vessel coronary atherosclerosis. 4. Few tiny scattered solid pulmonary nodules, largest 3 mm. No follow-up needed if patient is low-risk (and has no known or suspected primary neoplasm). Non-contrast chest CT can be considered in 12 months if patient is high-risk. This recommendation follows the  consensus statement: Guidelines for Management of Incidental Pulmonary Nodules Detected on CT Images: From the Fleischner Society 2017; Radiology 2017; 284:228-243. 5. Moderate to marked prostatomegaly. 6. Aortic Atherosclerosis (ICD10-I70.0).     Electronically Signed   By: Ilona Sorrel M.D.    On: 04/16/2021 09:05      STS Risk Score: Procedure: AVR + CAB Risk of Mortality: 1.364% Renal Failure: 2.764% Permanent Stroke: 1.436% Prolonged Ventilation: 10.173% DSW Infection: 0.298% Reoperation: 3.535% Morbidity or Mortality: 16.388% Short Length of Stay: 31.909% Long Length of Stay: 6.667%   Impression:  This 75 year old gentleman has stage D, severe, symptomatic aortic stenosis with New York Heart Association class II symptoms of exertional fatigue consistent with chronic diastolic congestive heart failure.  He has also had some episodes of dizziness particularly when getting up in the morning.  I have personally reviewed his 2D echocardiogram, cardiac catheterization, and CTA studies.  His echo shows a severely calcified aortic valve with restricted leaflet mobility.  The mean gradient is 37 mmHg with a peak velocity of 4.2 m/s consistent with severe aortic stenosis.  Left ventricular ejection fraction is 65 to 70%.  Cardiac catheterization shows mild plaque in the mid LAD with no significant obstruction in any of the coronary arteries.  The mean gradient across the aortic valve was 44 mmHg and the peak to peak gradient was 45 mmHg.  Right heart pressures were normal with a mildly elevated LVEDP of 18.  I agree that aortic valve replacement is indicated to improve his symptoms and prevent progressive left ventricular deterioration.  Given his age I think transcatheter aortic valve replacement is a reasonable treatment alternative.  His gated cardiac CTA shows anatomy suitable for TAVR using a SAPIEN 3 valve.  His abdominal and pelvic CTA shows adequate pelvic vascular anatomy to allow transfemoral insertion.  The patient and his wife were counseled at length regarding treatment alternatives for management of severe symptomatic aortic stenosis. The risks and benefits of surgical intervention has been discussed in detail. Long-term prognosis with medical therapy was discussed.  Alternative approaches such as conventional surgical aortic valve replacement, transcatheter aortic valve replacement, and palliative medical therapy were compared and contrasted at length. This discussion was placed in the context of the patient's own specific clinical presentation and past medical history. All of their questions have been addressed.   Following the decision to proceed with transcatheter aortic valve replacement, a discussion was held regarding what types of management strategies would be attempted intraoperatively in the event of life-threatening complications, including whether or not the patient would be considered a candidate for the use of cardiopulmonary bypass and/or conversion to open sternotomy for attempted surgical intervention.  I think he would be a candidate for emergent sternotomy to manage any intraoperative complications.  The patient is aware of the fact that transient use of cardiopulmonary bypass may be necessary. The patient has been advised of a variety of complications that might develop including but not limited to risks of death, stroke, paravalvular leak, aortic dissection or other major vascular complications, aortic annulus rupture, device embolization, cardiac rupture or perforation, mitral regurgitation, acute myocardial infarction, arrhythmia, heart block or bradycardia requiring permanent pacemaker placement, congestive heart failure, respiratory failure, renal failure, pneumonia, infection, other late complications related to structural valve deterioration or migration, or other complications that might ultimately cause a temporary or permanent loss of functional independence or other long term morbidity. The patient provides full informed consent for the procedure as described and all questions were answered.  Plan:  He will be scheduled for transfemoral transcatheter aortic valve replacement using a SAPIEN 3 valve.  I spent 60 minutes performing this  consultation and > 50% of this time was spent face to face counseling and coordinating the care of this patient's severe symptomatic aortic stenosis.   Gaye Pollack, MD 05/16/2021
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
## 2021-05-17 ENCOUNTER — Telehealth: Payer: Self-pay

## 2021-05-17 MED ORDER — SPIRONOLACTONE 25 MG PO TABS: 25.0000 mg | ORAL_TABLET | Freq: Every day | ORAL | 3 refills | Status: DC

## 2021-05-17 NOTE — Telephone Encounter (Signed)
From: Sanda Klein, MD  Sent: 05/17/2021  11:22 AM EDT  To: Barkley Boards, RN, Ricci Barker, RN   Hmm. He is maxed out on amlodipine and losartan and probably metoprolol (heart rate is usually in 50s, was 60 at this last visit). On HCTZ and K runs low, normal renal function.  Please add spironolactone 25 mg daily. It will take a couple of weeks to fully kick in.  Is he getting another BMET before the TAVR on 11/01?  ----- Message -----  From: Barkley Boards, RN  Sent: 05/16/2021   3:41 PM EDT  To: Sanda Klein, MD   Hey Dr Loletha Grayer,   I have posted Mr Hora for TAVR on 11/1.  I wanted to point out that his BP is very elevated.  He did his pre TAVR PT eval also today and they documented SBP 190-212.  The pt is currently on amlodipine, losartan HCT and metoprolol.  It looks like his BP has been elevated for the past few visits.  Would you want to make a medication change prior to TAVR or hold off until time of surgery?   Thanks,  Ellanora Rayborn       I spoke with the pt and his wife and made him aware of order from Dr Sallyanne Kuster to start spironolactone 25mg  daily in addition to his current medications.  The pt will have a BMP drawn at 10/28 PAT appointment.  Rx sent to the pharmacy. Pt and wife verbalized understanding of instructions.

## 2021-05-24 NOTE — Progress Notes (Signed)
Surgical Instructions    Your procedure is scheduled on November 1st, 2022.   Report to [facility] Main Entrance "A" at 08:45 A.M., then check in with the Admitting office.  Call this number if you have problems the morning of surgery:  [phone]   If you have any questions prior to your surgery date call [phone]: Open Monday-Friday 8am-4pm    Remember:  Do not eat or drink after midnight the night before your surgery   STOP on Tuesday (10/25) taking any Aspirin (unless otherwise instructed by your surgeon), Aleve, Naproxen, Ibuprofen, Motrin, Advil, Goody's, BC's, all herbal medications, fish oil, and all vitamins.   Continue taking all other medications without change through the day before surgery. On the morning of surgery do not take any medications.   As of today, STOP taking any Aspirin (unless otherwise instructed by your surgeon) Aleve, Naproxen, Ibuprofen, Motrin, Advil, Goody's, BC's, all herbal medications, fish oil, and all vitamins.    After your COVID test   You are not required to quarantine however you are required to wear a well-fitting mask when you are out and around people not in your household.  If your mask becomes wet or soiled, replace with a new one.  Wash your hands often with soap and water for 20 seconds or clean your hands with an alcohol-based hand sanitizer that contains at least 60% alcohol.  Do not share personal items.  Notify your provider: if you are in close contact with someone who has COVID  or if you develop a fever of 100.4 or greater, sneezing, cough, sore throat, shortness of breath or body aches.    The day of surgery          Do not wear jewelry Do not wear lotions, powders, colognes, or deodorant. Men may shave face and neck. Do not bring valuables to the hospital.              [facility] is not responsible for any belongings or valuables.  Do NOT Smoke (Tobacco/Vaping)  24 hours prior to your procedure  If you  use a CPAP at night, you may bring your mask for your overnight stay.   Contacts, glasses, hearing aids, dentures or partials may not be worn into surgery, please bring cases for these belongings   For patients admitted to the hospital, discharge time will be determined by your treatment team.   Patients discharged the day of surgery will not be allowed to drive home, and someone needs to stay with them for 24 hours.  NO VISITORS WILL BE ALLOWED IN PRE-OP WHERE PATIENTS ARE PREPPED FOR SURGERY.  ONLY 1 SUPPORT PERSON MAY BE PRESENT IN THE WAITING ROOM WHILE YOU ARE IN SURGERY.  IF YOU ARE TO BE ADMITTED, ONCE YOU ARE IN YOUR ROOM YOU WILL BE ALLOWED TWO (2) VISITORS. 1 (ONE) VISITOR MAY STAY OVERNIGHT BUT MUST ARRIVE TO THE ROOM BY 8pm.  Minor children may have two parents present. Special consideration for safety and communication needs will be reviewed on a case by case basis.  Special instructions:    Oral Hygiene is also important to reduce your risk of infection.  Remember - BRUSH YOUR TEETH THE MORNING OF SURGERY WITH YOUR REGULAR TOOTHPASTE   [facility]- Preparing For Surgery  Before surgery, you can play an important role. Because skin is not sterile, your skin needs to be as free of germs as possible. You can reduce the number of germs on your skin  by washing with CHG (chlorahexidine gluconate) Soap before surgery.  CHG is an antiseptic cleaner which kills germs and bonds with the skin to continue killing germs even after washing.     Please do not use if you have an allergy to CHG or antibacterial soaps. If your skin becomes reddened/irritated stop using the CHG.  Do not shave (including legs and underarms) for at least 48 hours prior to first CHG shower. It is OK to shave your face.  Please follow these instructions carefully.     Shower the NIGHT BEFORE SURGERY and the MORNING OF SURGERY with CHG Soap.   If you chose to wash your hair, wash your hair first as usual with your  normal shampoo. After you shampoo, rinse your hair and body thoroughly to remove the shampoo.  Then [employer] and genitals (private parts) with your normal soap and rinse thoroughly to remove soap.  After that Use CHG Soap as you would any other liquid soap. You can apply CHG directly to the skin and wash gently with a scrungie or a clean washcloth.   Apply the CHG Soap to your body ONLY FROM THE NECK DOWN.  Do not use on open wounds or open sores. Avoid contact with your eyes, ears, mouth and genitals (private parts). Wash Face and genitals (private parts)  with your normal soap.   Wash thoroughly, paying special attention to the area where your surgery will be performed.  Thoroughly rinse your body with warm water from the neck down.  DO NOT shower/wash with your normal soap after using and rinsing off the CHG Soap.  Pat yourself dry with a CLEAN TOWEL.  Wear CLEAN PAJAMAS to bed the night before surgery  Place CLEAN SHEETS on your bed the night before your surgery  DO NOT SLEEP WITH PETS.   Day of Surgery:  Take a shower with CHG soap. Wear Clean/Comfortable clothing the morning of surgery Do not apply any deodorants/lotions.   Remember to brush your teeth WITH YOUR REGULAR TOOTHPASTE.   Please read over the following fact sheets that you were given.

## 2021-05-25 ENCOUNTER — Other Ambulatory Visit: Payer: Self-pay

## 2021-05-25 ENCOUNTER — Ambulatory Visit (HOSPITAL_COMMUNITY)
Admission: RE | Admit: 2021-05-25 | Discharge: 2021-05-25 | Disposition: A | Discharge status: Home / Self Care | Payer: Medicare HMO | Source: Ambulatory Visit | Attending: Cardiovascular Disease | Admitting: Cardiovascular Disease

## 2021-05-25 ENCOUNTER — Encounter (HOSPITAL_COMMUNITY)
Admission: RE | Admit: 2021-05-25 | Discharge: 2021-05-25 | Disposition: A | Discharge status: Home / Self Care | Payer: Medicare HMO | Source: Ambulatory Visit | Attending: Cardiovascular Disease | Admitting: Cardiovascular Disease

## 2021-05-25 ENCOUNTER — Encounter (HOSPITAL_COMMUNITY): Payer: Self-pay

## 2021-05-25 VITALS — BP 191/77 | HR 62 | Temp 98.9°F | Resp 18 | Ht 68.0 in | Wt 256.5 lb

## 2021-05-25 DIAGNOSIS — I350 Nonrheumatic aortic (valve) stenosis: Secondary | ICD-10-CM

## 2021-05-25 DIAGNOSIS — Z01818 Encounter for other preprocedural examination: Secondary | ICD-10-CM

## 2021-05-25 DIAGNOSIS — Z20822 Contact with and (suspected) exposure to covid-19: Secondary | ICD-10-CM | POA: Diagnosis not present

## 2021-05-25 LAB — PROTIME-INR
INR: 1 (ref 0.8–1.2)
Prothrombin Time: 13.4 seconds (ref 11.4–15.2)

## 2021-05-25 LAB — COMPREHENSIVE METABOLIC PANEL
ALT: 20 U/L (ref 0–44)
AST: 23 U/L (ref 15–41)
Albumin: 4 g/dL (ref 3.5–5.0)
Alkaline Phosphatase: 73 U/L (ref 38–126)
Anion gap: 9 (ref 5–15)
BUN: 11 mg/dL (ref 8–23)
CO2: 22 mmol/L (ref 22–32)
Calcium: 9.3 mg/dL (ref 8.9–10.3)
Chloride: 104 mmol/L (ref 98–111)
Creatinine, Ser: 1.16 mg/dL (ref 0.61–1.24)
GFR, Estimated: >60 mL/min (ref >60)
Glucose, Bld: 122 mg/dL — ABNORMAL HIGH (ref 70–99)
Potassium: 3.8 mmol/L (ref 3.5–5.1)
Sodium: 135 mmol/L (ref 135–145)
Total Bilirubin: 0.9 mg/dL (ref 0.3–1.2)
Total Protein: 7.3 g/dL (ref 6.5–8.1)

## 2021-05-25 LAB — SURGICAL PCR SCREEN
MRSA, PCR: NEGATIVE
Staphylococcus aureus: NEGATIVE

## 2021-05-25 LAB — BLOOD GAS, ARTERIAL
Acid-base deficit: 0.4 mmol/L (ref 0.0–2.0)
Bicarbonate: 23.4 mmol/L (ref 20.0–28.0)
Drawn by: 602861
FIO2: 21
O2 Saturation: 98 %
Patient temperature: 37
pCO2 arterial: 35.9 mmHg (ref 32.0–48.0)
pH, Arterial: 7.429 (ref 7.350–7.450)
pO2, Arterial: 98 mmHg (ref 83.0–108.0)

## 2021-05-25 LAB — TYPE AND SCREEN
ABO/RH(D): O POS
Antibody Screen: NEGATIVE

## 2021-05-25 LAB — URINALYSIS, ROUTINE W REFLEX MICROSCOPIC
Bilirubin Urine: NEGATIVE
Glucose, UA: NEGATIVE mg/dL
Hgb urine dipstick: NEGATIVE
Ketones, ur: NEGATIVE mg/dL
Leukocytes,Ua: NEGATIVE
Nitrite: NEGATIVE
Protein, ur: NEGATIVE mg/dL
Specific Gravity, Urine: 1.018 (ref 1.005–1.030)
pH: 5 (ref 5.0–8.0)

## 2021-05-25 LAB — CBC
HCT: 43.1 % (ref 39.0–52.0)
Hemoglobin: 15.3 g/dL (ref 13.0–17.0)
MCH: 32.8 pg (ref 26.0–34.0)
MCHC: 35.5 g/dL (ref 30.0–36.0)
MCV: 92.5 fL (ref 80.0–100.0)
Platelets: 149 10*3/uL — ABNORMAL LOW (ref 150–400)
RBC: 4.66 MIL/uL (ref 4.22–5.81)
RDW: 12.6 % (ref 11.5–15.5)
WBC: 3.8 10*3/uL — ABNORMAL LOW (ref 4.0–10.5)
nRBC: 0 % (ref 0.0–0.2)

## 2021-05-25 LAB — SARS CORONAVIRUS 2 (TAT 6-24 HRS): SARS Coronavirus 2: NEGATIVE

## 2021-05-25 NOTE — Progress Notes (Signed)
PCP - Dr. Leanna Battles Cardiologist - Dr. Dani Gobble Croitoru  Chest x-ray - 05/25/21 EKG - 05/25/21 Stress Test - 2007 CE ECHO - 03/09/21 Cardiac Cath - 04/06/21  Sleep Study - No  DM - Denies  COVID TEST- 05/25/21   Anesthesia review: Yes cardiac history  Patient denies , fever, cough and chest pain at PAT appointment. Has been having some shortness of breath with exertion for a while 6-7 months.   All instructions explained to the patient, with a verbal understanding of the material. Patient agrees to go over the instructions while at home for a better understanding. Patient also instructed to self quarantine after being tested for wear a mask while in public. The opportunity to ask questions was provided.

## 2021-05-25 NOTE — Progress Notes (Signed)
   05/25/21 0916  OBSTRUCTIVE SLEEP APNEA  Have you ever been diagnosed with sleep apnea through a sleep study? No  Do you snore loudly (loud enough to be heard through closed doors)?  0  Do you often feel tired, fatigued, or sleepy during the daytime (such as falling asleep during driving or talking to someone)? 0  Has anyone observed you stop breathing during your sleep? 0  Do you have, or are you being treated for high blood pressure? 1  BMI more than 35 kg/m2? 1  Age > 50 (1-yes) 1  Neck circumference greater than:Male 16 inches or larger, Male 17inches or larger? 1  Male Gender (Yes=1) 1  Obstructive Sleep Apnea Score 5  Score 5 or greater  Results sent to PCP

## 2021-05-28 MED ORDER — HEPARIN 30,000 UNITS/1000 ML (OHS) CELLSAVER SOLUTION: Status: DC

## 2021-05-28 MED ORDER — DEXMEDETOMIDINE HCL IN NACL 400 MCG/100ML IV SOLN
0.1000 ug/kg/h | INTRAVENOUS | Status: AC
  Administered 2021-05-29: 1 ug/kg/h via INTRAVENOUS
  Administered 2021-05-29 (×2): 58.16 ug via INTRAVENOUS
  Filled 2021-05-28: qty 100

## 2021-05-28 MED ORDER — POTASSIUM CHLORIDE 2 MEQ/ML IV SOLN: 80.0000 meq | INTRAVENOUS | Status: DC

## 2021-05-28 MED ORDER — CEFAZOLIN SODIUM-DEXTROSE 2-4 GM/100ML-% IV SOLN: 2.0000 g | INTRAVENOUS | Status: DC

## 2021-05-28 MED ORDER — MAGNESIUM SULFATE 50 % IJ SOLN
40.0000 meq | INTRAMUSCULAR | Status: DC
  Filled 2021-05-28: qty 9.85

## 2021-05-28 MED ORDER — HEPARIN 30,000 UNITS/1000 ML (OHS) CELLSAVER SOLUTION
Status: DC
  Filled 2021-05-28: qty 1000

## 2021-05-28 MED ORDER — NOREPINEPHRINE 4 MG/250ML-% IV SOLN
0.0000 ug/min | INTRAVENOUS | Status: AC
  Administered 2021-05-29: 1 ug/min via INTRAVENOUS
  Filled 2021-05-28: qty 250

## 2021-05-28 MED ORDER — CEFAZOLIN SODIUM-DEXTROSE 2-4 GM/100ML-% IV SOLN
2.0000 g | INTRAVENOUS | Status: AC
  Administered 2021-05-29: 2 g via INTRAVENOUS
  Filled 2021-05-28 (×2): qty 100

## 2021-05-28 MED ORDER — DEXMEDETOMIDINE HCL IN NACL 400 MCG/100ML IV SOLN: 0.1000 ug/kg/h | INTRAVENOUS | Status: DC

## 2021-05-28 MED ORDER — NOREPINEPHRINE 4 MG/250ML-% IV SOLN: 0.0000 ug/min | INTRAVENOUS | Status: DC

## 2021-05-28 MED ORDER — POTASSIUM CHLORIDE 2 MEQ/ML IV SOLN
80.0000 meq | INTRAVENOUS | Status: DC
Start: 2021-05-29 — End: 2021-05-29
  Filled 2021-05-28: qty 40

## 2021-05-28 MED ORDER — MAGNESIUM SULFATE 50 % IJ SOLN: 40.0000 meq | INTRAMUSCULAR | Status: DC

## 2021-05-28 NOTE — H&P (Signed)
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  Cardiovascular: Mild cardiomegaly. Diffuse thickening and coarse  calcification of the aortic valve. No significant pericardial  effusion/thickening. Three-vessel coronary atherosclerosis.  Atherosclerotic nonaneurysmal thoracic aorta. Top-normal caliber  main pulmonary artery (3.3 cm diameter). No central pulmonary  emboli.  Mediastinum/Nodes: No discrete thyroid nodules. Unremarkable  esophagus. No pathologically enlarged axillary, mediastinal or hilar  lymph nodes.  Lungs/Pleura: No pneumothorax. No pleural effusion. No acute  consolidative airspace disease or lung masses. A few tiny scattered  solid  pulmonary nodules in both lungs, largest 3 mm in the anterior  right lower lobe (series 7/image 54).  Musculoskeletal: No aggressive appearing focal osseous lesions.  Moderate thoracic spondylosis.  CTA ABDOMEN AND PELVIS FINDINGS  Hepatobiliary: Normal liver with no liver mass. Normal gallbladder  with no radiopaque cholelithiasis. No biliary ductal dilatation.  Pancreas: Normal, with no mass or duct dilation.  Spleen: Normal size. No mass.  Adrenals/Urinary Tract: Symmetric mild lobulated fullness of the  bilateral adrenal glands without discrete adrenal nodules,  suggesting mild adrenal hyperplasia. No contour deforming renal  masses. No hydronephrosis. Normal bladder.  Stomach/Bowel: Normal non-distended stomach. Normal caliber small  bowel with no small bowel wall thickening. Normal appendix. Normal  large bowel with no diverticulosis, large bowel wall thickening or  pericolonic fat stranding.  Vascular/Lymphatic: Mildly atherosclerotic nonaneurysmal abdominal  aorta. No pathologically enlarged lymph nodes in the abdomen or  pelvis.  Reproductive: Moderate to marked prostatomegaly.  Other: No pneumoperitoneum, ascites or focal fluid collection.  Musculoskeletal: No aggressive appearing focal osseous lesions.  Moderate lumbar spondylosis.  VASCULAR MEASUREMENTS PERTINENT TO TAVR:  AORTA:  Minimal Aortic Diameter-16.1 x 13.7 mm  Severity of Aortic Calcification-mild  RIGHT PELVIS:  Right Common Iliac Artery -  Minimal Diameter-9.5 x 7.5 mm  Tortuosity-mild  Calcification-mild-to-moderate  Right External Iliac Artery -  Minimal Diameter-8.6 x 8.5 mm  Tortuosity-moderate  Calcification-mild  Right Common Femoral Artery -  Minimal Diameter-8.5 x 6.8 mm  Tortuosity-mild  Calcification-moderate  LEFT PELVIS:  Left Common Iliac Artery -  Minimal Diameter-10.0 x 9.8 mm  Tortuosity-mild  Calcification-moderate  Left External Iliac Artery -  Minimal Diameter-8.5 x 7.9 mm   Tortuosity-moderate  Calcification-mild  Left Common Femoral Artery -  Minimal Diameter-8.3 x 6.7 mm  Tortuosity-mild  Calcification-moderate  Review of the MIP images confirms the above findings.  IMPRESSION:  1. Vascular findings and measurements pertinent to potential TAVR  procedure, as detailed.  2. Diffuse thickening and coarse calcification of the aortic valve,  compatible with the reported history of aortic stenosis.  3. Mild cardiomegaly. Three-vessel coronary atherosclerosis.  4. Few tiny scattered solid pulmonary nodules, largest 3 mm. No  follow-up needed if patient is low-risk (and has  no known or  suspected primary neoplasm). Non-contrast chest CT can be considered  in 12 months if patient is high-risk. This recommendation follows  the consensus statement: Guidelines for Management of Incidental  Pulmonary Nodules Detected on CT Images: From the Fleischner Society  2017; Radiology 2017; 284:228-243.  5. Moderate to marked prostatomegaly.  6. Aortic Atherosclerosis (ICD10-I70.0).  Electronically Signed  By: Ilona Sorrel M.D.  On: 04/16/2021 09:05   STS Risk Score:  Procedure: AVR + CAB  Risk of Mortality:  1.364%  Renal Failure:  2.764%  Permanent Stroke:  1.436%  Prolonged Ventilation:  10.173%  DSW Infection:  0.298%  Reoperation:  3.535%  Morbidity or Mortality:  16.388%  Short Length of Stay:  31.909%  Long Length of Stay:  6.667%  Impression:  This 75 year old gentleman has stage D, severe, symptomatic aortic stenosis with New York Heart Association class II symptoms of exertional fatigue consistent with chronic diastolic congestive heart failure. He has also had some episodes of dizziness particularly when getting up in the morning. I have personally reviewed his 2D echocardiogram, cardiac catheterization, and CTA studies. His echo shows a severely calcified aortic valve with restricted leaflet mobility. The mean gradient is 37 mmHg with a peak  velocity of 4.2 m/s consistent with severe aortic stenosis. Left ventricular ejection fraction is 65 to 70%. Cardiac catheterization shows mild plaque in the mid LAD with no significant obstruction in any of the coronary arteries. The mean gradient across the aortic valve was 44 mmHg and the peak to peak gradient was 45 mmHg. Right heart pressures were normal with a mildly elevated LVEDP of 18. I agree that aortic valve replacement is indicated to improve his symptoms and prevent progressive left ventricular deterioration. Given his age I think transcatheter aortic valve replacement is a reasonable treatment alternative. His gated cardiac CTA shows anatomy suitable for TAVR using a SAPIEN 3 valve. His abdominal and pelvic CTA shows adequate pelvic vascular anatomy to allow transfemoral insertion.  The patient and his wife were counseled at length regarding treatment alternatives for management of severe symptomatic aortic stenosis. The risks and benefits of surgical intervention has been discussed in detail. Long-term prognosis with medical therapy was discussed. Alternative approaches such as conventional surgical aortic valve replacement, transcatheter aortic valve replacement, and palliative medical therapy were compared and contrasted at length. This discussion was placed in the context of the patient's own specific clinical presentation and past medical history. All of their questions have been addressed.  Following the decision to proceed with transcatheter aortic valve replacement, a discussion was held regarding what types of management strategies would be attempted intraoperatively in the event of life-threatening complications, including whether or not the patient would be considered a candidate for the use of cardiopulmonary bypass and/or conversion to open sternotomy for attempted surgical intervention. I think he would be a candidate for emergent sternotomy to manage any intraoperative complications.  The patient is aware of the fact that transient use of cardiopulmonary bypass may be necessary. The patient has been advised of a variety of complications that might develop including but not limited to risks of death, stroke, paravalvular leak, aortic dissection or other major vascular complications, aortic annulus rupture, device embolization, cardiac rupture or perforation, mitral regurgitation, acute myocardial infarction, arrhythmia, heart block or bradycardia requiring permanent pacemaker placement, congestive heart failure, respiratory failure, renal failure, pneumonia, infection, other late complications related to structural valve deterioration or migration, or other complications that might ultimately cause a temporary or permanent  loss of functional independence or other long term morbidity. The patient provides full informed consent for the procedure as described and all questions were answered.   Plan:   Transfemoral transcatheter aortic valve replacement using a SAPIEN 3 valve.  Gaye Pollack, MD

## 2021-05-29 ENCOUNTER — Inpatient Hospital Stay (HOSPITAL_COMMUNITY): Payer: Medicare HMO

## 2021-05-29 ENCOUNTER — Other Ambulatory Visit: Payer: Self-pay | Admitting: Physician Assistant

## 2021-05-29 ENCOUNTER — Inpatient Hospital Stay (HOSPITAL_COMMUNITY)
Admission: RE | Admit: 2021-05-29 | Discharge: 2021-05-30 | DRG: 267 | Disposition: A | Discharge status: Home / Self Care | Payer: Medicare HMO | Source: Ambulatory Visit | Attending: Cardiovascular Disease | Admitting: Cardiovascular Disease

## 2021-05-29 ENCOUNTER — Inpatient Hospital Stay (HOSPITAL_COMMUNITY): Payer: Medicare HMO | Admitting: Anesthesiology

## 2021-05-29 ENCOUNTER — Encounter (HOSPITAL_COMMUNITY)
Admission: RE | Disposition: A | Discharge status: Home / Self Care | Payer: Self-pay | Source: Ambulatory Visit | Attending: Cardiovascular Disease

## 2021-05-29 ENCOUNTER — Encounter (HOSPITAL_COMMUNITY): Payer: Self-pay | Admitting: Cardiovascular Disease

## 2021-05-29 ENCOUNTER — Inpatient Hospital Stay (HOSPITAL_COMMUNITY): Payer: Medicare HMO | Admitting: Physician Assistant

## 2021-05-29 DIAGNOSIS — Z79899 Other long term (current) drug therapy: Secondary | ICD-10-CM

## 2021-05-29 DIAGNOSIS — Z87891 Personal history of nicotine dependence: Secondary | ICD-10-CM | POA: Diagnosis not present

## 2021-05-29 DIAGNOSIS — I350 Nonrheumatic aortic (valve) stenosis: Secondary | ICD-10-CM | POA: Diagnosis not present

## 2021-05-29 DIAGNOSIS — E669 Obesity, unspecified: Secondary | ICD-10-CM | POA: Diagnosis present

## 2021-05-29 DIAGNOSIS — Z952 Presence of prosthetic heart valve: Secondary | ICD-10-CM

## 2021-05-29 DIAGNOSIS — D509 Iron deficiency anemia, unspecified: Secondary | ICD-10-CM | POA: Diagnosis not present

## 2021-05-29 DIAGNOSIS — E7800 Pure hypercholesterolemia, unspecified: Secondary | ICD-10-CM | POA: Diagnosis present

## 2021-05-29 DIAGNOSIS — Z6838 Body mass index (BMI) 38.0-38.9, adult: Secondary | ICD-10-CM | POA: Diagnosis not present

## 2021-05-29 DIAGNOSIS — I10 Essential (primary) hypertension: Secondary | ICD-10-CM | POA: Diagnosis not present

## 2021-05-29 DIAGNOSIS — Z8249 Family history of ischemic heart disease and other diseases of the circulatory system: Secondary | ICD-10-CM

## 2021-05-29 DIAGNOSIS — I2510 Atherosclerotic heart disease of native coronary artery without angina pectoris: Secondary | ICD-10-CM | POA: Diagnosis present

## 2021-05-29 DIAGNOSIS — I082 Rheumatic disorders of both aortic and tricuspid valves: Secondary | ICD-10-CM | POA: Diagnosis not present

## 2021-05-29 DIAGNOSIS — Z006 Encounter for examination for normal comparison and control in clinical research program: Secondary | ICD-10-CM | POA: Diagnosis not present

## 2021-05-29 DIAGNOSIS — E119 Type 2 diabetes mellitus without complications: Secondary | ICD-10-CM | POA: Diagnosis not present

## 2021-05-29 DIAGNOSIS — Z880 Allergy status to penicillin: Secondary | ICD-10-CM | POA: Diagnosis not present

## 2021-05-29 DIAGNOSIS — E785 Hyperlipidemia, unspecified: Secondary | ICD-10-CM | POA: Diagnosis not present

## 2021-05-29 HISTORY — PX: TRANSCATHETER AORTIC VALVE REPLACEMENT, TRANSFEMORAL: SHX6400

## 2021-05-29 HISTORY — PX: TEE WITHOUT CARDIOVERSION: SHX5443

## 2021-05-29 HISTORY — DX: Presence of prosthetic heart valve: Z95.2

## 2021-05-29 LAB — ECHOCARDIOGRAM LIMITED
AR max vel: 1.93 cm2
AV Area VTI: 1.83 cm2
AV Area mean vel: 1.98 cm2
AV Mean grad: 21 mmHg
AV Peak grad: 35.5 mmHg
Ao pk vel: 2.98 m/s
Calc EF: 73.4 %
Single Plane A2C EF: 76.4 %
Single Plane A4C EF: 70.9 %

## 2021-05-29 LAB — POCT I-STAT, CHEM 8
BUN: 12 mg/dL (ref 8–23)
BUN: 12 mg/dL (ref 8–23)
Calcium, Ion: 1.21 mmol/L (ref 1.15–1.40)
Calcium, Ion: 1.26 mmol/L (ref 1.15–1.40)
Chloride: 104 mmol/L (ref 98–111)
Chloride: 103 mmol/L (ref 98–111)
Creatinine, Ser: 0.9 mg/dL (ref 0.61–1.24)
Creatinine, Ser: 1 mg/dL (ref 0.61–1.24)
Glucose, Bld: 130 mg/dL — ABNORMAL HIGH (ref 70–99)
Glucose, Bld: 146 mg/dL — ABNORMAL HIGH (ref 70–99)
HCT: 44 % (ref 39.0–52.0)
HCT: 37 % — ABNORMAL LOW (ref 39.0–52.0)
Hemoglobin: 15 g/dL (ref 13.0–17.0)
Hemoglobin: 12.6 g/dL — ABNORMAL LOW (ref 13.0–17.0)
Potassium: 3.8 mmol/L (ref 3.5–5.1)
Potassium: 3.9 mmol/L (ref 3.5–5.1)
Sodium: 141 mmol/L (ref 135–145)
Sodium: 140 mmol/L (ref 135–145)
TCO2: 25 mmol/L (ref 22–32)
TCO2: 24 mmol/L (ref 22–32)

## 2021-05-29 LAB — ABO/RH: ABO/RH(D): O POS

## 2021-05-29 LAB — GLUCOSE, CAPILLARY: Glucose-Capillary: 133 mg/dL — ABNORMAL HIGH (ref 70–99)

## 2021-05-29 SURGERY — IMPLANTATION, AORTIC VALVE, TRANSCATHETER, FEMORAL APPROACH
Anesthesia: Monitor Anesthesia Care | Site: Chest

## 2021-05-29 MED ORDER — LACTATED RINGERS IV SOLN
INTRAVENOUS | Status: DC
  Administered 2021-05-29: 10 mL via INTRAVENOUS

## 2021-05-29 MED ORDER — HEPARIN SODIUM (PORCINE) 1000 UNIT/ML IJ SOLN
INTRAMUSCULAR | Status: DC | PRN
  Administered 2021-05-29: 17000 [IU] via INTRAVENOUS

## 2021-05-29 MED ORDER — MORPHINE SULFATE (PF) 2 MG/ML IV SOLN: 1.0000 mg | INTRAVENOUS | Status: DC | PRN

## 2021-05-29 MED ORDER — ACETAMINOPHEN 325 MG PO TABS
650.0000 mg | ORAL_TABLET | Freq: Four times a day (QID) | ORAL | Status: DC | PRN
  Filled 2021-05-29: qty 2

## 2021-05-29 MED ORDER — HEPARIN 6000 UNIT IRRIGATION SOLUTION
Status: DC | PRN
  Administered 2021-05-29 (×3): 1

## 2021-05-29 MED ORDER — IODIXANOL 320 MG/ML IV SOLN
INTRAVENOUS | Status: DC | PRN
  Administered 2021-05-29: 60 mL via INTRA_ARTERIAL

## 2021-05-29 MED ORDER — PROTAMINE SULFATE 10 MG/ML IV SOLN
INTRAVENOUS | Status: AC
  Filled 2021-05-29: qty 25

## 2021-05-29 MED ORDER — PROTAMINE SULFATE 10 MG/ML IV SOLN
INTRAVENOUS | Status: DC | PRN
  Administered 2021-05-29: 170 mg via INTRAVENOUS

## 2021-05-29 MED ORDER — LIDOCAINE HCL 1 % IJ SOLN
INTRAMUSCULAR | Status: DC | PRN
  Administered 2021-05-29: 20 mL via INTRADERMAL

## 2021-05-29 MED ORDER — SODIUM CHLORIDE 0.9 % IV SOLN: INTRAVENOUS | Status: AC

## 2021-05-29 MED ORDER — SODIUM CHLORIDE 0.9% FLUSH: 3.0000 mL | Freq: Two times a day (BID) | INTRAVENOUS | Status: DC

## 2021-05-29 MED ORDER — CHLORHEXIDINE GLUCONATE 4 % EX LIQD: 30.0000 mL | CUTANEOUS | Status: DC

## 2021-05-29 MED ORDER — ATORVASTATIN CALCIUM 10 MG PO TABS
20.0000 mg | ORAL_TABLET | Freq: Every day | ORAL | Status: DC
  Administered 2021-05-29 – 2021-05-30 (×2): 20 mg via ORAL
  Filled 2021-05-29 (×2): qty 2

## 2021-05-29 MED ORDER — CHLORHEXIDINE GLUCONATE 4 % EX LIQD: 60.0000 mL | Freq: Once | CUTANEOUS | Status: DC

## 2021-05-29 MED ORDER — ONDANSETRON HCL 4 MG/2ML IJ SOLN: 4.0000 mg | Freq: Four times a day (QID) | INTRAMUSCULAR | Status: DC | PRN

## 2021-05-29 MED ORDER — SODIUM CHLORIDE 0.9% FLUSH: 3.0000 mL | INTRAVENOUS | Status: DC | PRN

## 2021-05-29 MED ORDER — SODIUM CHLORIDE 0.9 % IV SOLN: 250.0000 mL | INTRAVENOUS | Status: DC | PRN

## 2021-05-29 MED ORDER — NITROGLYCERIN IN D5W 200-5 MCG/ML-% IV SOLN: 0.0000 ug/min | INTRAVENOUS | Status: DC

## 2021-05-29 MED ORDER — HEPARIN 6000 UNIT IRRIGATION SOLUTION
Status: AC
  Filled 2021-05-29: qty 1500

## 2021-05-29 MED ORDER — LIDOCAINE HCL 1 % IJ SOLN
INTRAMUSCULAR | Status: AC
  Filled 2021-05-29: qty 20

## 2021-05-29 MED ORDER — TRAMADOL HCL 50 MG PO TABS: 50.0000 mg | ORAL_TABLET | ORAL | Status: DC | PRN

## 2021-05-29 MED ORDER — OXYCODONE HCL 5 MG PO TABS: 5.0000 mg | ORAL_TABLET | ORAL | Status: DC | PRN

## 2021-05-29 MED ORDER — PHENYLEPHRINE 40 MCG/ML (10ML) SYRINGE FOR IV PUSH (FOR BLOOD PRESSURE SUPPORT)
PREFILLED_SYRINGE | INTRAVENOUS | Status: DC | PRN
  Administered 2021-05-29: 40 ug via INTRAVENOUS
  Administered 2021-05-29: 80 ug via INTRAVENOUS
  Administered 2021-05-29: 40 ug via INTRAVENOUS

## 2021-05-29 MED ORDER — PROPOFOL 500 MG/50ML IV EMUL
INTRAVENOUS | Status: DC | PRN
  Administered 2021-05-29: 20 ug/kg/min via INTRAVENOUS

## 2021-05-29 MED ORDER — PHENYLEPHRINE 40 MCG/ML (10ML) SYRINGE FOR IV PUSH (FOR BLOOD PRESSURE SUPPORT)
PREFILLED_SYRINGE | INTRAVENOUS | Status: AC
  Filled 2021-05-29: qty 10

## 2021-05-29 MED ORDER — CHLORHEXIDINE GLUCONATE 0.12 % MT SOLN
15.0000 mL | Freq: Once | OROMUCOSAL | Status: DC
  Filled 2021-05-29: qty 15

## 2021-05-29 MED ORDER — ASPIRIN 81 MG PO CHEW
81.0000 mg | CHEWABLE_TABLET | Freq: Every day | ORAL | Status: DC
  Administered 2021-05-30: 81 mg via ORAL
  Filled 2021-05-29: qty 1

## 2021-05-29 MED ORDER — CHLORHEXIDINE GLUCONATE 0.12 % MT SOLN
15.0000 mL | Freq: Once | OROMUCOSAL | Status: AC
  Administered 2021-05-29: 15 mL via OROMUCOSAL

## 2021-05-29 MED ORDER — SPIRONOLACTONE 25 MG PO TABS
25.0000 mg | ORAL_TABLET | Freq: Every day | ORAL | Status: DC
  Administered 2021-05-30: 25 mg via ORAL
  Filled 2021-05-29: qty 1

## 2021-05-29 MED ORDER — ACETAMINOPHEN 650 MG RE SUPP: 650.0000 mg | Freq: Four times a day (QID) | RECTAL | Status: DC | PRN

## 2021-05-29 MED ORDER — VANCOMYCIN HCL IN DEXTROSE 1-5 GM/200ML-% IV SOLN
1000.0000 mg | Freq: Once | INTRAVENOUS | Status: AC
  Administered 2021-05-29: 1000 mg via INTRAVENOUS
  Filled 2021-05-29: qty 200

## 2021-05-29 MED ORDER — SODIUM CHLORIDE 0.9 % IV SOLN
INTRAVENOUS | Status: DC
  Administered 2021-05-29: 10 mL via INTRAVENOUS

## 2021-05-29 MED ORDER — ORAL CARE MOUTH RINSE: 15.0000 mL | Freq: Once | OROMUCOSAL | Status: AC

## 2021-05-29 MED ORDER — HEPARIN SODIUM (PORCINE) 1000 UNIT/ML IJ SOLN
INTRAMUSCULAR | Status: AC
  Filled 2021-05-29: qty 1

## 2021-05-29 MED ORDER — HEPARIN 6000 UNIT IRRIGATION SOLUTION
Status: AC
  Filled 2021-05-29: qty 500

## 2021-05-29 MED ORDER — HYDRALAZINE HCL 20 MG/ML IJ SOLN
5.0000 mg | Freq: Once | INTRAMUSCULAR | Status: AC
  Administered 2021-05-29: 5 mg via INTRAVENOUS
  Filled 2021-05-29: qty 1
  Filled 2021-05-29: qty 0.25

## 2021-05-29 MED ORDER — AMLODIPINE BESYLATE 10 MG PO TABS
10.0000 mg | ORAL_TABLET | Freq: Every day | ORAL | Status: DC
  Administered 2021-05-30: 10 mg via ORAL
  Filled 2021-05-29: qty 1

## 2021-05-29 SURGICAL SUPPLY — 84 items
ADH SKN CLS APL DERMABOND .7 (GAUZE/BANDAGES/DRESSINGS) ×2
APL PRP STRL LF DISP 70% ISPRP (MISCELLANEOUS) ×2
BAG COUNTER SPONGE SURGICOUNT (BAG) ×3 IMPLANT
BAG DECANTER FOR FLEXI CONT (MISCELLANEOUS) IMPLANT
BAG SNAP BAND KOVER 36X36 (MISCELLANEOUS) ×3 IMPLANT
BAG SPNG CNTER NS LX DISP (BAG) ×2
BLADE CLIPPER SURG (BLADE) IMPLANT
BLADE OSCILLATING /SAGITTAL (BLADE) IMPLANT
BLADE STERNUM SYSTEM 6 (BLADE) IMPLANT
BLADE SURG 10 STRL SS (BLADE) IMPLANT
CABLE ADAPT CONN TEMP 6FT (ADAPTER) ×3 IMPLANT
CATH DIAG EXPO 6F AL1 (CATHETERS) IMPLANT
CATH DIAG EXPO 6F VENT PIG 145 (CATHETERS) ×6 IMPLANT
CATH EXTERNAL FEMALE PUREWICK (CATHETERS) IMPLANT
CATH INFINITI 6F AL2 (CATHETERS) IMPLANT
CATH S G BIP PACING (CATHETERS) ×3 IMPLANT
CHLORAPREP W/TINT 26 (MISCELLANEOUS) ×3 IMPLANT
CLIP VESOCCLUDE MED 24/CT (CLIP) IMPLANT
CLIP VESOCCLUDE SM WIDE 24/CT (CLIP) IMPLANT
CLOSURE MYNX CONTROL 6F/7F (Vascular Products) ×1 IMPLANT
CNTNR URN SCR LID CUP LEK RST (MISCELLANEOUS) ×4 IMPLANT
CONT SPEC 4OZ STRL OR WHT (MISCELLANEOUS) ×6
COVER BACK TABLE 80X110 HD (DRAPES) ×3 IMPLANT
DECANTER SPIKE VIAL GLASS SM (MISCELLANEOUS) ×3 IMPLANT
DERMABOND ADVANCED (GAUZE/BANDAGES/DRESSINGS) ×1
DERMABOND ADVANCED .7 DNX12 (GAUZE/BANDAGES/DRESSINGS) ×2 IMPLANT
DEVICE CLOSURE PERCLS PRGLD 6F (VASCULAR PRODUCTS) ×4 IMPLANT
DRAPE INCISE IOBAN 66X45 STRL (DRAPES) IMPLANT
DRSG TEGADERM 4X4.75 (GAUZE/BANDAGES/DRESSINGS) ×6 IMPLANT
ELECT CAUTERY BLADE 6.4 (BLADE) IMPLANT
ELECT REM PT RETURN 9FT ADLT (ELECTROSURGICAL) ×3
ELECTRODE REM PT RTRN 9FT ADLT (ELECTROSURGICAL) ×4 IMPLANT
FELT TEFLON 6X6 (MISCELLANEOUS) IMPLANT
GAUZE SPONGE 4X4 12PLY STRL (GAUZE/BANDAGES/DRESSINGS) ×4 IMPLANT
GLOVE EUDERMIC 7 POWDERFREE (GLOVE) IMPLANT
GLOVE SURG ENC MOIS LTX SZ7.5 (GLOVE) ×3 IMPLANT
GLOVE SURG ENC MOIS LTX SZ8 (GLOVE) IMPLANT
GLOVE SURG ORTHO LTX SZ7.5 (GLOVE) IMPLANT
GOWN STRL REUS W/ TWL LRG LVL3 (GOWN DISPOSABLE) IMPLANT
GOWN STRL REUS W/ TWL XL LVL3 (GOWN DISPOSABLE) ×2 IMPLANT
GOWN STRL REUS W/TWL LRG LVL3 (GOWN DISPOSABLE)
GOWN STRL REUS W/TWL XL LVL3 (GOWN DISPOSABLE) ×3
GUIDEWIRE SAFE TJ AMPLATZ EXST (WIRE) ×3 IMPLANT
INSERT FOGARTY SM (MISCELLANEOUS) IMPLANT
KIT BASIN OR (CUSTOM PROCEDURE TRAY) ×3 IMPLANT
KIT HEART LEFT (KITS) ×3 IMPLANT
KIT SAPIAN 3 ULTRA RESILIA 26 (Valve) ×1 IMPLANT
KIT SUCTION CATH 14FR (SUCTIONS) IMPLANT
KIT TURNOVER KIT B (KITS) ×3 IMPLANT
LOOP VESSEL MAXI BLUE (MISCELLANEOUS) IMPLANT
LOOP VESSEL MINI RED (MISCELLANEOUS) IMPLANT
NS IRRIG 1000ML POUR BTL (IV SOLUTION) ×3 IMPLANT
PACK ENDO MINOR (CUSTOM PROCEDURE TRAY) ×3 IMPLANT
PAD ARMBOARD 7.5X6 YLW CONV (MISCELLANEOUS) ×5 IMPLANT
PAD ELECT DEFIB RADIOL ZOLL (MISCELLANEOUS) ×3 IMPLANT
PENCIL BUTTON HOLSTER BLD 10FT (ELECTRODE) IMPLANT
PERCLOSE PROGLIDE 6F (VASCULAR PRODUCTS) ×6
POSITIONER HEAD DONUT 9IN (MISCELLANEOUS) ×3 IMPLANT
SET MICROPUNCTURE 5F STIFF (MISCELLANEOUS) ×3 IMPLANT
SHEATH BRITE TIP 7FR 35CM (SHEATH) ×3 IMPLANT
SHEATH PINNACLE 6F 10CM (SHEATH) ×3 IMPLANT
SHEATH PINNACLE 8F 10CM (SHEATH) ×3 IMPLANT
SLEEVE REPOSITIONING LENGTH 30 (MISCELLANEOUS) ×3 IMPLANT
STOPCOCK MORSE 400PSI 3WAY (MISCELLANEOUS) ×6 IMPLANT
SUT ETHIBOND X763 2 0 SH 1 (SUTURE) IMPLANT
SUT GORETEX CV 4 TH 22 36 (SUTURE) IMPLANT
SUT GORETEX CV4 TH-18 (SUTURE) IMPLANT
SUT MNCRL AB 3-0 PS2 18 (SUTURE) IMPLANT
SUT PROLENE 5 0 C 1 36 (SUTURE) IMPLANT
SUT PROLENE 6 0 C 1 30 (SUTURE) IMPLANT
SUT SILK  1 MH (SUTURE) ×3
SUT SILK 1 MH (SUTURE) ×2 IMPLANT
SUT VIC AB 2-0 CT1 27 (SUTURE)
SUT VIC AB 2-0 CT1 TAPERPNT 27 (SUTURE) IMPLANT
SUT VIC AB 2-0 CTX 36 (SUTURE) IMPLANT
SUT VIC AB 3-0 SH 8-18 (SUTURE) IMPLANT
SYR 50ML LL SCALE MARK (SYRINGE) ×3 IMPLANT
SYR BULB IRRIG 60ML STRL (SYRINGE) IMPLANT
SYR MEDRAD MARK V 150ML (SYRINGE) ×3 IMPLANT
TOWEL GREEN STERILE (TOWEL DISPOSABLE) ×5 IMPLANT
TRANSDUCER W/STOPCOCK (MISCELLANEOUS) ×6 IMPLANT
TRAY FOLEY SLVR 16FR TEMP STAT (SET/KITS/TRAYS/PACK) IMPLANT
WIRE EMERALD 3MM-J .035X150CM (WIRE) ×3 IMPLANT
WIRE EMERALD 3MM-J .035X260CM (WIRE) ×3 IMPLANT

## 2021-05-29 NOTE — Discharge Instructions (Signed)

## 2021-05-29 NOTE — Anesthesia Procedure Notes (Signed)
Procedure Name: MAC Date/Time: 05/29/2021 11:40 AM Performed by: Griffin Dakin, CRNA Pre-anesthesia Checklist: Patient identified, Emergency Drugs available, Suction available, Patient being monitored and Timeout performed Patient Re-evaluated:Patient Re-evaluated prior to induction Oxygen Delivery Method: Simple face mask Preoxygenation: Pre-oxygenation with 100% oxygen Induction Type: IV induction

## 2021-05-29 NOTE — Transfer of Care (Signed)
Immediate Anesthesia Transfer of Care Note  Patient: Bernard Garcia.  Procedure(s) Performed: TRANSCATHETER AORTIC VALVE REPLACEMENT, TRANSFEMORAL (Chest) TRANSESOPHAGEAL ECHOCARDIOGRAM (TEE)  Patient Location: PACU  Anesthesia Type:MAC  Level of Consciousness: drowsy  Airway & Oxygen Therapy: Patient Spontanous Breathing and Patient connected to face mask oxygen  Post-op Assessment: Report given to RN and Post -op Vital signs reviewed and stable  Post vital signs: Reviewed and stable  Last Vitals:  Vitals Value Taken Time  BP    Temp    Pulse 98 05/29/21 1333  Resp    SpO2      Last Pain:  Vitals:   05/29/21 0933  TempSrc:   PainSc: 0-No pain         Complications: No notable events documented.

## 2021-05-29 NOTE — Interval H&P Note (Signed)
History and Physical Interval Note:  05/29/2021 9:15 AM  Bernard Garcia.  has presented today for surgery, with the diagnosis of Severe Aortic Stenosis.  The various methods of treatment have been discussed with the patient and family. After consideration of risks, benefits and other options for treatment, the patient has consented to  Procedure(s): TRANSCATHETER AORTIC VALVE REPLACEMENT, TRANSFEMORAL (N/A) TRANSESOPHAGEAL ECHOCARDIOGRAM (TEE) (N/A) as a surgical intervention.  The patient's history has been reviewed, patient examined, no change in status, stable for surgery.  I have reviewed the patient's chart and labs.  Questions were answered to the patient's satisfaction.     Gaye Pollack

## 2021-05-29 NOTE — Anesthesia Preprocedure Evaluation (Signed)
Anesthesia Evaluation  Patient identified by MRN, date of birth, ID band Patient awake    Reviewed: Allergy & Precautions, NPO status , Patient's Chart, lab work & pertinent test results  Airway Mallampati: II  TM Distance: >3 FB Neck ROM: Full    Dental no notable dental hx.    Pulmonary neg pulmonary ROS, former smoker,    Pulmonary exam normal breath sounds clear to auscultation       Cardiovascular hypertension, Pt. on medications + Valvular Problems/Murmurs AS  Rhythm:Regular Rate:Normal + Systolic murmurs trivial mitral regurgitation and normal left ventricular ejection fraction of 60 to 65% with mild concentric LVH and grade 1 diastolic dysfunction. A follow-up echocardiogram on 03/09/2021 showed a mean gradient of 37 mmHg with a peak gradient of 71 mmHg. V-max was 4.2 m/s. Aortic valve was severely calcified with severely reduced leaflet mobility. Left ventricular ejection fraction remained normal.   Poorly controlled HTN   Neuro/Psych negative neurological ROS  negative psych ROS   GI/Hepatic negative GI ROS, Neg liver ROS,   Endo/Other  negative endocrine ROSdiabetes  Renal/GU negative Renal ROS  negative genitourinary   Musculoskeletal negative musculoskeletal ROS (+)   Abdominal   Peds negative pediatric ROS (+)  Hematology negative hematology ROS (+)   Anesthesia Other Findings   Reproductive/Obstetrics negative OB ROS                             Anesthesia Physical Anesthesia Plan  ASA: 4  Anesthesia Plan: MAC   Post-op Pain Management:    Induction: Intravenous  PONV Risk Score and Plan: 1 and Propofol infusion and Treatment may vary due to age or medical condition  Airway Management Planned: Simple Face Mask  Additional Equipment:   Intra-op Plan:   Post-operative Plan:   Informed Consent: I have reviewed the patients History and Physical, chart, labs and  discussed the procedure including the risks, benefits and alternatives for the proposed anesthesia with the patient or authorized representative who has indicated his/her understanding and acceptance.     Dental advisory given  Plan Discussed with: CRNA and Surgeon  Anesthesia Plan Comments:         Anesthesia Quick Evaluation

## 2021-05-29 NOTE — Op Note (Signed)
[facility]   MULTIDISCIPLINARY HEART VALVE TEAM   TAVR OPERATIVE NOTE   Date of Procedure:  05/29/2021  Preoperative Diagnosis: Severe Aortic Stenosis   Postoperative Diagnosis: Same   Procedure:   Transcatheter Aortic Valve Replacement - Percutaneous Right Transfemoral Approach  Edwards Sapien 3 Ultra RSL THV (size 26 mm, model # 9755RSL serial # [id number])   Co-Surgeons:  Gaye Pollack, MD and Lauree Chandler, MD Anesthesiologist:  G. Kalman Shan, MD  Echocardiographer:  P. Johnsie Cancel, MD  Pre-operative Echo Findings: Severe aortic stenosis Normal left ventricular systolic function  Post-operative Echo Findings: No paravalvular leak Normal left ventricular systolic function   BRIEF CLINICAL NOTE AND INDICATIONS FOR SURGERY  This 75 year old gentleman has stage D, severe, symptomatic aortic stenosis with New York Heart Association class II symptoms of exertional fatigue consistent with chronic diastolic congestive heart failure. He has also had some episodes of dizziness particularly when getting up in the morning. I have personally reviewed his 2D echocardiogram, cardiac catheterization, and CTA studies. His echo shows a severely calcified aortic valve with restricted leaflet mobility. The mean gradient is 37 mmHg with a peak velocity of 4.2 m/s consistent with severe aortic stenosis. Left ventricular ejection fraction is 65 to 70%. Cardiac catheterization shows mild plaque in the mid LAD with no significant obstruction in any of the coronary arteries. The mean gradient across the aortic valve was 44 mmHg and the peak to peak gradient was 45 mmHg. Right heart pressures were normal with a mildly elevated LVEDP of 18. I agree that aortic valve replacement is indicated to improve his symptoms and prevent progressive left ventricular deterioration. Given his age I think transcatheter aortic valve replacement is a reasonable treatment alternative. His gated cardiac CTA shows  anatomy suitable for TAVR using a SAPIEN 3 valve. His abdominal and pelvic CTA shows adequate pelvic vascular anatomy to allow transfemoral insertion.  The patient and his wife were counseled at length regarding treatment alternatives for management of severe symptomatic aortic stenosis. The risks and benefits of surgical intervention has been discussed in detail. Long-term prognosis with medical therapy was discussed. Alternative approaches such as conventional surgical aortic valve replacement, transcatheter aortic valve replacement, and palliative medical therapy were compared and contrasted at length. This discussion was placed in the context of the patient's own specific clinical presentation and past medical history. All of their questions have been addressed.  Following the decision to proceed with transcatheter aortic valve replacement, a discussion was held regarding what types of management strategies would be attempted intraoperatively in the event of life-threatening complications, including whether or not the patient would be considered a candidate for the use of cardiopulmonary bypass and/or conversion to open sternotomy for attempted surgical intervention. I think he would be a candidate for emergent sternotomy to manage any intraoperative complications. The patient is aware of the fact that transient use of cardiopulmonary bypass may be necessary. The patient has been advised of a variety of complications that might develop including but not limited to risks of death, stroke, paravalvular leak, aortic dissection or other major vascular complications, aortic annulus rupture, device embolization, cardiac rupture or perforation, mitral regurgitation, acute myocardial infarction, arrhythmia, heart block or bradycardia requiring permanent pacemaker placement, congestive heart failure, respiratory failure, renal failure, pneumonia, infection, other late complications related to structural valve  deterioration or migration, or other complications that might ultimately cause a temporary or permanent loss of functional independence or other long term morbidity. The patient provides full informed consent for  the procedure as described and all questions were answered.      DETAILS OF THE OPERATIVE PROCEDURE  PREPARATION:    The patient was brought to the operating room on the above mentioned date and appropriate monitoring was established by the anesthesia team. The patient was placed in the supine position on the operating table.  Intravenous antibiotics were administered. The patient was monitored closely throughout the procedure under conscious sedation.    Baseline transthoracic echocardiogram was performed. The patient's abdomen and both groins were prepped and draped in a sterile manner. A time out procedure was performed.   PERIPHERAL ACCESS:    Using the modified Seldinger technique, femoral arterial and venous access was obtained with placement of 6 Fr sheaths on the left side.  A pigtail diagnostic catheter was passed through the left arterial sheath under fluoroscopic guidance into the aortic root.  A temporary transvenous pacemaker catheter was passed through the left femoral venous sheath under fluoroscopic guidance into the right ventricle.  The pacemaker was tested to ensure stable lead placement and pacemaker capture. Aortic root angiography was performed in order to determine the optimal angiographic angle for valve deployment.   TRANSFEMORAL ACCESS:   Percutaneous transfemoral access and sheath placement was performed using ultrasound guidance.  The right common femoral artery was cannulated using a micropuncture needle and appropriate location was verified using hand injection angiogram.  A pair of Abbott Perclose percutaneous closure devices were placed and a 6 French sheath replaced into the femoral artery.  The patient was heparinized systemically and ACT verified > 250  seconds.    A 14 Fr transfemoral E-sheath was introduced into the right common femoral artery after progressively dilating over an Amplatz superstiff wire. An AL- 2catheter was used to direct a straight-tip exchange length wire across the native aortic valve into the left ventricle. This was exchanged out for a pigtail catheter and position was confirmed in the LV apex. Simultaneous LV and Ao pressures were recorded.  The pigtail catheter was exchanged for an Amplatz Extra-stiff wire in the LV apex.   BALLOON AORTIC VALVULOPLASTY:   Not performed   TRANSCATHETER HEART VALVE DEPLOYMENT:   An Edwards Sapien 3 Ultra transcatheter heart valve (size 26 mm) was prepared and crimped per manufacturer's guidelines, and the proper orientation of the valve is confirmed on the [employer] delivery system. The valve was advanced through the introducer sheath using normal technique until in an appropriate position in the abdominal aorta beyond the sheath tip. The balloon was then retracted and using the fine-tuning wheel was centered on the valve. The valve was then advanced across the aortic arch using appropriate flexion of the catheter. The valve was carefully positioned across the aortic valve annulus. The Commander catheter was retracted using normal technique. Once final position of the valve has been confirmed by angiographic assessment, the valve is deployed while temporarily holding ventilation and during rapid ventricular pacing to maintain systolic blood pressure < 50 mmHg and pulse pressure < 10 mmHg. The balloon inflation is held for >3 seconds after reaching full deployment volume. Once the balloon has fully deflated the balloon is retracted into the ascending aorta and valve function is assessed using echocardiography. There is felt to be no paravalvular leak and no central aortic insufficiency.  The patient's hemodynamic recovery following valve deployment is good.  The deployment balloon and  guidewire are both removed.    PROCEDURE COMPLETION:   The sheath was removed and femoral artery closure performed.  Protamine was administered once femoral arterial repair was complete. The temporary pacemaker, pigtail catheters and femoral sheaths were removed with manual pressure used for hemostasis.  A Mynx femoral closure device was utilized following removal of the diagnostic sheath in the left femoral artery.  The patient tolerated the procedure well and is transported to the cath lab recovery area in stable condition. There were no immediate intraoperative complications. All sponge instrument and needle counts are verified correct at completion of the operation.   No blood products were administered during the operation.  The patient received a total of 60 mL of intravenous contrast during the procedure.   Gaye Pollack, MD 05/29/2021

## 2021-05-29 NOTE — Anesthesia Postprocedure Evaluation (Signed)
Anesthesia Post Note  Patient: Bernard Garcia.  Procedure(s) Performed: TRANSCATHETER AORTIC VALVE REPLACEMENT, TRANSFEMORAL (Chest) TRANSESOPHAGEAL ECHOCARDIOGRAM (TEE)     Patient location during evaluation: PACU Anesthesia Type: MAC Level of consciousness: awake and alert Pain management: pain level controlled Vital Signs Assessment: post-procedure vital signs reviewed and stable Respiratory status: spontaneous breathing, nonlabored ventilation, respiratory function stable and patient connected to nasal cannula oxygen Cardiovascular status: stable and blood pressure returned to baseline Postop Assessment: no apparent nausea or vomiting Anesthetic complications: no   No notable events documented.  Last Vitals:  Vitals:   05/29/21 1420 05/29/21 1425  BP:  (!) 113/54  Pulse: (!) 44 (!) 43  Resp: 20 16  Temp:    SpO2: 100% 99%    Last Pain:  Vitals:   05/29/21 1351  TempSrc:   PainSc: 0-No pain                 [name] S

## 2021-05-29 NOTE — CV Procedure (Signed)
[facility]  TAVR OPERATIVE NOTE   Date of Procedure:  05/29/2021  Preoperative Diagnosis: Severe Aortic Stenosis   Postoperative Diagnosis: Same   Procedure:   Transcatheter Aortic Valve Replacement - Transfemoral Approach  Edwards Sapien 3 THV (size 26 mm, model # [id number], serial # [id number])   Co-Surgeons:  Lauree Chandler, MD and Gaye Pollack, MD  Anesthesiologist:  Kalman Shan  Echocardiographer:  Johnsie Cancel  Pre-operative Echo Findings: Severe aortic stenosis Normal left ventricular systolic function  Post-operative Echo Findings: No paravalvular leak Normal left ventricular systolic function  BRIEF CLINICAL NOTE AND INDICATIONS FOR SURGERY   75 yo male with history of diabetes mellitus, hyperlipidemia, HTN, obesity and severe aortic stenosis who is here today for TAVR. Echo 03/09/21 with LVEF=65-70%, mild concentric LVH. The aortic valve leaflets are thickened and calcified with limited leaflet excursion. Mean gradient 37 mmHg, peak gradient 70.9 mmHg, AVA 0.81 cm2, dimensionless index 0.37. There is severe AS. Cath with mild CAD.   During the course of the patient's preoperative work up they have been evaluated comprehensively by a multidisciplinary team of specialists coordinated through the [facility] in the [facility].  They have been demonstrated to suffer from symptomatic severe aortic stenosis as noted above. The patient has been counseled extensively as to the relative risks and benefits of all options for the treatment of severe aortic stenosis including long term medical therapy, conventional surgery for aortic valve replacement, and transcatheter aortic valve replacement.  The patient has been independently evaluated by Dr. Cyndia Bent with CT surgery and they are felt to be at high risk for conventional surgical aortic valve replacement. The surgeon indicated the patient would be a poor candidate for  conventional surgery. Based upon review of all of the patient's preoperative diagnostic tests they are felt to be candidate for transcatheter aortic valve replacement using the transfemoral approach as an alternative to high risk conventional surgery.    Following the decision to proceed with transcatheter aortic valve replacement, a discussion has been held regarding what types of management strategies would be attempted intraoperatively in the event of life-threatening complications, including whether or not the patient would be considered a candidate for the use of cardiopulmonary bypass and/or conversion to open sternotomy for attempted surgical intervention.  The patient has been advised of a variety of complications that might develop peculiar to this approach including but not limited to risks of death, stroke, paravalvular leak, aortic dissection or other major vascular complications, aortic annulus rupture, device embolization, cardiac rupture or perforation, acute myocardial infarction, arrhythmia, heart block or bradycardia requiring permanent pacemaker placement, congestive heart failure, respiratory failure, renal failure, pneumonia, infection, other late complications related to structural valve deterioration or migration, or other complications that might ultimately cause a temporary or permanent loss of functional independence or other long term morbidity.  The patient provides full informed consent for the procedure as described and all questions were answered preoperatively.    DETAILS OF THE OPERATIVE PROCEDURE  PREPARATION:   The patient is brought to the operating room on the above mentioned date and central monitoring was established by the anesthesia team including placement of a radial arterial line. The patient is placed in the supine position on the operating table.  Intravenous antibiotics are administered. Conscious sedation is used.   Baseline transthoracic echocardiogram was  performed. The patient's chest, abdomen, both groins, and both lower extremities are prepared and draped in a sterile manner. A time out  procedure is performed.   PERIPHERAL ACCESS:   Using the modified Seldinger technique, femoral arterial and venous access were obtained with placement of a 6 Fr sheath in the artery and a 7 Fr sheath in the vein on the left side using u/s guidance.  A pigtail diagnostic catheter was passed through the femoral arterial sheath under fluoroscopic guidance into the aortic root.  A temporary transvenous pacemaker catheter was passed through the femoral venous sheath under fluoroscopic guidance into the right ventricle.  The pacemaker was tested to ensure stable lead placement and pacemaker capture. Aortic root angiography was performed in order to determine the optimal angiographic angle for valve deployment.  TRANSFEMORAL ACCESS:  A micropuncture kit was used to gain access to the right femoral artery using u/s guidance. Position confirmed with angiography. Pre-closure with double ProGlide closure devices. The patient was heparinized systemically and ACT verified > 250 seconds.    A 14 Fr transfemoral E-sheath was introduced into the right femoral artery after progressively dilating over an Amplatz superstiff wire. An AL-2 catheter was used to direct a straight-tip exchange length wire across the native aortic valve into the left ventricle. This was exchanged out for a pigtail catheter and position was confirmed in the LV apex. Simultaneous LV and Ao pressures were recorded.  The pigtail catheter was then exchanged for an Amplatz Extra-stiff wire in the LV apex.   TRANSCATHETER HEART VALVE DEPLOYMENT:  An Edwards Sapien 3 THV (size 26 mm) was prepared and crimped per manufacturer's guidelines, and the proper orientation of the valve is confirmed on the [employer] delivery system. The valve was advanced through the introducer sheath using normal technique until in an  appropriate position in the abdominal aorta beyond the sheath tip. The balloon was then retracted and using the fine-tuning wheel was centered on the valve. The valve was then advanced across the aortic arch using appropriate flexion of the catheter. The valve was carefully positioned across the aortic valve annulus. The Commander catheter was retracted using normal technique. Once final position of the valve has been confirmed by angiographic assessment, the valve is deployed while temporarily holding ventilation and during rapid ventricular pacing to maintain systolic blood pressure < 50 mmHg and pulse pressure < 10 mmHg. The balloon inflation is held for >3 seconds after reaching full deployment volume. Once the balloon has fully deflated the balloon is retracted into the ascending aorta and valve function is assessed using TTE. There is felt to be no paravalvular leak and no central aortic insufficiency.  The patient's hemodynamic recovery following valve deployment is good.  The deployment balloon and guidewire are both removed. Echo demostrated acceptable post-procedural gradients, stable mitral valve function, and no AI.   PROCEDURE COMPLETION:  The sheath was then removed and closure devices were completed. Protamine was administered once femoral arterial repair was complete. The temporary pacemaker, pigtail catheters and femoral sheaths were removed with a Mynx closure device placed in the artery and manual pressure used for venous hemostasis.    The patient tolerated the procedure well and is transported to the surgical intensive care in stable condition. There were no immediate intraoperative complications. All sponge instrument and needle counts are verified correct at completion of the operation.   No blood products were administered during the operation.  The patient received a total of 60 mL of intravenous contrast during the procedure.  Lauree Chandler MD 05/29/2021 1:19 PM

## 2021-05-29 NOTE — Progress Notes (Signed)
Echocardiogram 2D Echocardiogram has been performed.  Oneal Deputy Aqsa Sensabaugh RDCS 05/29/2021, 2:23 PM

## 2021-05-29 NOTE — Progress Notes (Signed)
Pt BP elevated and Dr. Kalman Shan notified orders received will continue to monitor.

## 2021-05-29 NOTE — Progress Notes (Signed)
Mobility Specialist: Progress Note   05/29/21 1741  Mobility  Activity Ambulated in hall  Level of Assistance Independent  Assistive Device None  Distance Ambulated (ft) 450 ft  Mobility Ambulated independently in hallway  Mobility Response Tolerated well  Mobility performed by Mobility specialist  $Mobility charge 1 Mobility   Pre-Mobility: 39 HR, 117/65 BP, 100% SpO2 During Mobility: 47 HR Post-Mobility: 42 HR, 156/60 BP, 100% SpO2  Pt had no c/o during ambulation. Pt to BR after walk and then back to bed. Pt's wife present in the room.   [facility] Trev Boley Mobility Specialist Mobility Specialist Phone: [phone]

## 2021-05-30 ENCOUNTER — Encounter (HOSPITAL_COMMUNITY): Payer: Self-pay | Admitting: Cardiovascular Disease

## 2021-05-30 ENCOUNTER — Inpatient Hospital Stay (HOSPITAL_COMMUNITY): Payer: Medicare HMO

## 2021-05-30 DIAGNOSIS — I2510 Atherosclerotic heart disease of native coronary artery without angina pectoris: Secondary | ICD-10-CM | POA: Diagnosis not present

## 2021-05-30 DIAGNOSIS — I350 Nonrheumatic aortic (valve) stenosis: Principal | ICD-10-CM

## 2021-05-30 DIAGNOSIS — Z952 Presence of prosthetic heart valve: Secondary | ICD-10-CM | POA: Diagnosis not present

## 2021-05-30 DIAGNOSIS — Z006 Encounter for examination for normal comparison and control in clinical research program: Secondary | ICD-10-CM | POA: Diagnosis not present

## 2021-05-30 DIAGNOSIS — I10 Essential (primary) hypertension: Secondary | ICD-10-CM | POA: Diagnosis not present

## 2021-05-30 LAB — BASIC METABOLIC PANEL
Anion gap: 6 (ref 5–15)
BUN: 12 mg/dL (ref 8–23)
CO2: 25 mmol/L (ref 22–32)
Calcium: 8.5 mg/dL — ABNORMAL LOW (ref 8.9–10.3)
Chloride: 102 mmol/L (ref 98–111)
Creatinine, Ser: 1.07 mg/dL (ref 0.61–1.24)
GFR, Estimated: >60 mL/min (ref >60)
Glucose, Bld: 85 mg/dL (ref 70–99)
Potassium: 3.6 mmol/L (ref 3.5–5.1)
Sodium: 133 mmol/L — ABNORMAL LOW (ref 135–145)

## 2021-05-30 LAB — ECHOCARDIOGRAM COMPLETE
AR max vel: 2.57 cm2
AV Area VTI: 2.53 cm2
AV Area mean vel: 2.69 cm2
AV Mean grad: 11.8 mmHg
AV Peak grad: 22.8 mmHg
Ao pk vel: 2.39 m/s
Area-P 1/2: 2.13 cm2
Height: 68 in
S' Lateral: 2.6 cm
Weight: 4141.12 oz

## 2021-05-30 LAB — CBC
HCT: 38.6 % — ABNORMAL LOW (ref 39.0–52.0)
Hemoglobin: 13.5 g/dL (ref 13.0–17.0)
MCH: 32.2 pg (ref 26.0–34.0)
MCHC: 35 g/dL (ref 30.0–36.0)
MCV: 92.1 fL (ref 80.0–100.0)
Platelets: 119 10*3/uL — ABNORMAL LOW (ref 150–400)
RBC: 4.19 MIL/uL — ABNORMAL LOW (ref 4.22–5.81)
RDW: 12.4 % (ref 11.5–15.5)
WBC: 4.3 10*3/uL (ref 4.0–10.5)
nRBC: 0 % (ref 0.0–0.2)

## 2021-05-30 LAB — MAGNESIUM: Magnesium: 1.9 mg/dL (ref 1.7–2.4)

## 2021-05-30 MED ORDER — ASPIRIN 81 MG PO CHEW: 81.0000 mg | CHEWABLE_TABLET | Freq: Every day | ORAL | 6 refills | Status: DC

## 2021-05-30 NOTE — Progress Notes (Signed)
  Echocardiogram 2D Echocardiogram post TAVR has been performed.  Darlina Sicilian M 05/30/2021, 10:32 AM

## 2021-05-30 NOTE — Discharge Summary (Addendum)
[place] VALVE TEAM  Discharge Summary    Patient ID: Bernard Garcia. MRN: [id number]; DOB: Mar 10, 1946  Admit date: 05/29/2021 Discharge date: 05/30/2021  Primary Care Provider: Leanna Battles, MD  Primary Cardiologist: None   Discharge Diagnoses    Principal Problem:   S/P TAVR (transcatheter aortic valve replacement) Active Problems:   Iron deficiency anemia, unspecified   HTN (hypertension)   Hypercholesteremia  Allergies Allergies  Allergen Reactions   Penicillins Rash    Unknown reaction Has patient had a PCN reaction causing immediate rash, facial/tongue/throat swelling, SOB or lightheadedness with hypotension: NO Has patient had a PCN reaction causing severe rash involving mucus membranes or skin necrosis: NO Has patient had a PCN reaction that required hospitalization NO Has patient had a PCN reaction occurring within the last 10 years: NO If all of the above answers are "NO", then may proceed with Cephalosporin use.   Diagnostic Studies/Procedures      TAVR OPERATIVE NOTE     Date of Procedure:                05/29/2021   Preoperative Diagnosis:      Severe Aortic Stenosis    Postoperative Diagnosis:    Same    Procedure:        Transcatheter Aortic Valve Replacement - Percutaneous Right Transfemoral Approach             Edwards Sapien 3 Ultra RSL THV (size 26 mm, model # [id number] serial # [id number])              Co-Surgeons:                        Gaye Pollack, MD and Lauree Chandler, MD Anesthesiologist:                  Rodell Perna, MD   Echocardiographer:              Edmonia James, MD   Pre-operative Echo Findings: Severe aortic stenosis Normal left ventricular systolic function   Post-operative Echo Findings: No paravalvular leak Normal left ventricular systolic function _____________   Echo 05/30/21: Completed but pending formal read at the time of discharge   History of Present Illness      Bernard Garcia. is a 75 y.o. male with a history of DM2, HLD, HTN, obesity and severe aortic stenosis who presented to [facility] on 05/29/21 for planned TAVR.   Mr. Ramesh was referred from Dr. Sallyanne Kuster for further discussion regarding his aortic stenosis and possible TAVR. He has been followed for moderate aortic stenosis. Echo cardiogram from 03/09/21 showed an LVEF at 65-70%, mild concentric LVH. The aortic valve leaflets were thickened and calcified with limited leaflet excursion. Mean gradient 37 mmHg, peak gradient 70.9 mmHg, AVA 0.81 cm2, dimensionless index 0.37 consistent with severe AS.   He was seen in consultation with Dr. Angelena Form 03/27/21 at which time he had fatigue with exertion and lower extremity edema. He was referred to Dr. Benson Norway due to a single tooth infection and was cleared for TAVR.  [facility] 04/06/21 showed mild plaque in the mid LAD, no obstructive disease in the Circumflex or RCA. Severe aortic stenosis (mean gradient 44 mmHg, peak to peak gradient 45 mmHg).   The patient has been evaluated by the multidisciplinary valve team and felt to have severe, symptomatic aortic stenosis and to be a suitable candidate for TAVR, which was set  up for 05/29/21.   Hospital Course    Severe AS: s/p successful TAVR with a 26 mm Edwards Sapien 3 THV via the TF approach on 05/29/21. Post operative echo pending. Groin sites are stable. Post procedure instructions reviewed. ECG with NSR/SB with known 1st degree AV block. Heart rates have been in the high 50's. He is asymptomatic. Patient was started on ASA 81mg  QD monotherapy. He ambulated with CR without complications and was referred for CRII. SBE will be dicussed at follow up.   Incidental findings: Tiny 2 mm pulmonary nodules in the lungs, nonspecific and statistically likely benign. No follow-up needed if patient is low-risk (and has no known or suspected primary neoplasm). Non-contrast chest CT can be considered in 12 months if patient is high-risk.    HLD: Last LDL, 53 from 2014>>plan to update in the OP setting.   HTN: Elevated however antihypertensives held post procedure. Will resume current PTA medications at d/c and can titrate OP if needed. May need to re-address losartan, spironolactone, and K+ supplementation. K+ stable over the last 2 years with no hyperkalemia however something to note.   Consultants: None    The patient was seen an examined by Dr. Angelena Form who feels that he is stable and ready for discharge today, 05/30/21.  _____________  Discharge Vitals Blood pressure (!) 174/77, pulse 63, temperature 99 F (37.2 C), temperature source Oral, resp. rate 20, height 5\' 8"  (1.727 m), weight 117.4 kg, SpO2 99 %.  Filed Weights   05/29/21 0853 05/30/21 0453  Weight: 114.8 kg 117.4 kg   General: Well developed, well nourished, NAD Lungs:Clear to ausculation bilaterally. No wheezes, rales, or rhonchi. Breathing is unlabored. Cardiovascular: RRR with S1 S2. Soft murmur Extremities: No edema.  Neuro: Alert and oriented.  Psych: Responds to questions appropriately with normal affect.    Labs & Radiologic Studies    CBC Recent Labs    05/29/21 1408 05/30/21 0242  WBC  --  4.3  HGB 12.6* 13.5  HCT 37.0* 38.6*  MCV  --  92.1  PLT  --  379*   Basic Metabolic Panel Recent Labs    05/29/21 1408 05/30/21 0242  NA 140 133*  K 3.9 3.6  CL 103 102  CO2  --  25  GLUCOSE 146* 85  BUN 12 12  CREATININE 1.00 1.07  CALCIUM  --  8.5*  MG  --  1.9   Liver Function Tests No results for input(s): AST, ALT, ALKPHOS, BILITOT, PROT, ALBUMIN in the last 72 hours. No results for input(s): LIPASE, AMYLASE in the last 72 hours. Cardiac Enzymes No results for input(s): CKTOTAL, CKMB, CKMBINDEX, TROPONINI in the last 72 hours. BNP Invalid input(s): POCBNP D-Dimer No results for input(s): DDIMER in the last 72 hours. Hemoglobin A1C No results for input(s): HGBA1C in the last 72 hours. Fasting Lipid Panel No results for  input(s): CHOL, HDL, LDLCALC, TRIG, CHOLHDL, LDLDIRECT in the last 72 hours. Thyroid Function Tests No results for input(s): TSH, T4TOTAL, T3FREE, THYROIDAB in the last 72 hours.  Invalid input(s): FREET3 _____________  DG Chest 2 View  Result Date: 05/27/2021 CLINICAL DATA:  Pre admission for TAVR. EXAM: CHEST - 2 VIEW COMPARISON:  02/22/2019 FINDINGS: Lungs are adequately inflated without focal airspace consolidation or effusion. Cardiomediastinal silhouette and remainder of the exam is unchanged. IMPRESSION: No active cardiopulmonary disease. Electronically Signed   By: Marin Olp M.D.   On: 05/27/2021 11:33   ECHOCARDIOGRAM LIMITED  Result Date: 05/29/2021  ECHOCARDIOGRAM LIMITED REPORT   Patient Name:   Nafis Farnan. Date of Exam: 05/29/2021 Medical Rec #:  [id number]         Height:       68.0 in Accession #:    [id number]        Weight:       253.0 lb Date of Birth:  09/24/1945        BSA:          2.258 m Patient Age:    75 years          BP:           139/60 mmHg Patient Gender: M                 HR:           69 bpm. Exam Location:  Inpatient Procedure: Limited Echo, Color Doppler and Cardiac Doppler Indications:     Aortic Stenosis i35.0  History:         Patient has prior history of Echocardiogram examinations, most                  recent 03/09/2021. Risk Factors:Hypertension and Dyslipidemia.  Sonographer:     Raquel Sarna Senior RDCS Referring Phys:  [place] Diagnosing Phys: Jenkins Rouge MD  Sonographer Comments: 46mm Edwards [phone] TAVR Implanted IMPRESSIONS  1. Left ventricular ejection fraction, by estimation, is 65 to 70%. The left ventricle has normal function. There is mild left ventricular hypertrophy. Left ventricular diastolic parameters are consistent with Grade I diastolic dysfunction (impaired relaxation).  2. Right ventricular systolic function is normal. The right ventricular size is normal.  3. Left atrial size was mildly dilated.  4. Tricuspid valve  regurgitation is severe.  5. Pre TAVR: tri leaflet valve severe stenosis with mild AR mean gradinet 30 mm peak 52 mmhg flat in OR AVA 1.5 cm2     Post TAVR: well positioned 26 mm Sapien 3 valve No significant PVL mean gradient 10 peak 19 mmHg AVA 2.8 cm2. The aortic valve is calcified. Aortic valve regurgitation is mild. Severe aortic valve stenosis. FINDINGS  Left Ventricle: Left ventricular ejection fraction, by estimation, is 65 to 70%. The left ventricle has normal function. The left ventricular internal cavity size was small. There is mild left ventricular hypertrophy. Left ventricular diastolic parameters are consistent with Grade I diastolic dysfunction (impaired relaxation). Right Ventricle: The right ventricular size is normal. No increase in right ventricular wall thickness. Right ventricular systolic function is normal. Left Atrium: Left atrial size was mildly dilated. Pericardium: There is no evidence of pericardial effusion. Tricuspid Valve: Tricuspid valve regurgitation is severe. Aortic Valve: Pre TAVR: tri leaflet valve severe stenosis with mild AR mean gradinet 30 mm peak 52 mmhg flat in OR AVA 1.5 cm2 Post TAVR: well positioned 26 mm Sapien 3 valve No significant PVL mean gradient 10 peak 19 mmHg AVA 2.8 cm2. The aortic valve is calcified. Aortic valve regurgitation is mild. Severe aortic stenosis is present. Aortic valve mean gradient measures 21.0 mmHg. Aortic valve peak gradient measures 35.5 mmHg. Aortic valve area, by VTI measures 1.83 cm. LEFT VENTRICLE PLAX 2D LVOT diam:     2.30 cm LV SV:         121 LV SV Index:   54 LVOT Area:     4.15 cm  LV Volumes (MOD) LV vol d, MOD A2C: 83.5 ml LV vol d, MOD A4C: 79.4 ml LV vol s, MOD  A2C: 19.7 ml LV vol s, MOD A4C: 23.1 ml LV SV MOD A2C:     63.8 ml LV SV MOD A4C:     79.4 ml LV SV MOD BP:      61.1 ml AORTIC VALVE AV Area (Vmax):    1.93 cm AV Area (Vmean):   1.98 cm AV Area (VTI):     1.83 cm AV Vmax:           297.75 cm/s AV Vmean:           209.500 cm/s AV VTI:            0.664 m AV Peak Grad:      35.5 mmHg AV Mean Grad:      21.0 mmHg LVOT Vmax:         138.00 cm/s LVOT Vmean:        100.000 cm/s LVOT VTI:          0.292 m LVOT/AV VTI ratio: 0.44  SHUNTS Systemic VTI:  0.29 m Systemic Diam: 2.30 cm Jenkins Rouge MD Electronically signed by Jenkins Rouge MD Signature Date/Time: 05/29/2021/1:54:51 PM    Final    Structural Heart Procedure  Result Date: 05/29/2021 See surgical note for result.  Disposition   Pt is being discharged home today in good condition.  Follow-up Plans & Appointments    Follow-up Information     Eileen Stanford, PA-C. Go on 06/06/2021.   Specialties: Cardiology, Radiology Why: @ 3:30pm, please arrive at least 10 minutes early. Contact information: [place] [id number] [phone]                Discharge Instructions     Amb Referral to Cardiac Rehabilitation   Complete by: As directed    Diagnosis: Valve Replacement   Valve: Aortic Comment - TAVR   After initial evaluation and assessments completed: Virtual Based Care may be provided alone or in conjunction with Phase 2 Cardiac Rehab based on patient barriers.: Yes   Call MD for:  difficulty breathing, headache or visual disturbances   Complete by: As directed    Call MD for:  extreme fatigue   Complete by: As directed    Call MD for:  hives   Complete by: As directed    Call MD for:  persistant dizziness or light-headedness   Complete by: As directed    Call MD for:  persistant nausea and vomiting   Complete by: As directed    Call MD for:  redness, tenderness, or signs of infection (pain, swelling, redness, odor or green/yellow discharge around incision site)   Complete by: As directed    Call MD for:  severe uncontrolled pain   Complete by: As directed    Call MD for:  temperature >100.4   Complete by: As directed    Diet - low sodium heart healthy   Complete by: As directed    Discharge  instructions   Complete by: As directed    ACTIVITY AND EXERCISE  Daily activity and exercise are an important part of your recovery. People recover at different rates depending on their general health and type of valve procedure.  Most people recovering from TAVR feel better relatively quickly   No lifting, pushing, pulling more than 10 pounds (examples to avoid: groceries, vacuuming, gardening, golfing):             - For one week with a procedure through the groin.             -  For six weeks for procedures through the chest wall or neck. NOTE: You will typically see one of our providers 7-14 days after your procedure to discuss [place] the above activities.      DRIVING  Do not drive until you are seen for follow up and cleared by a provider. Generally, we ask patient to not drive for 1 week after their procedure.  If you have been told by your doctor in the past that you may not drive, you must talk with him/her before you begin driving again.   DRESSING  Groin site: you may leave the clear dressing over the site for up to one week or until it falls off.   HYGIENE  If you had a femoral (leg) procedure, you may take a shower when you return home. After the shower, pat the site dry. Do NOT use powder, oils or lotions in your groin area until the site has completely healed.  If you had a chest procedure, you may shower when you return home unless specifically instructed not to by your discharging practitioner.             - DO NOT scrub incision; pat dry with a towel.             - DO NOT apply any lotions, oils, powders to the incision.             - No tub baths / swimming for at least 2 weeks.  If you notice any fevers, chills, increased pain, swelling, bleeding or pus, please contact your doctor.   ADDITIONAL INFORMATION  If you are going to have an upcoming dental procedure, please contact our office as you will require antibiotics ahead of time to prevent infection  on your heart valve.    If you have any questions or concerns you can call the structural heart phone during normal business hours 8am-4pm. If you have an urgent need after hours or weekends please call [phone] to talk to the on call provider for general cardiology. If you have an emergency that requires immediate attention, please call 911.    After TAVR Checklist  Check  Test Description  Follow up appointment in 1-2 weeks  You will see our structural heart advanced practice provider. Your incision sites will be checked and you will be cleared to drive and resume all normal activities if you are doing well.    1 month echo and follow up  You will have an echo to check on your new heart valve and be seen back in the office by a structural heart advanced practice provider.  Follow up with your primary cardiologist You will need to be seen by your primary cardiologist in the following 3-6 months after your 1 month appointment in the valve clinic. Often times your Plavix or Aspirin will be discontinued during this time, but this is decided on a case by case basis.   1 year echo and follow up You will have another echo to check on your heart valve after 1 year and be seen back in the office by a structural heart advanced practice provider. This your last structural heart visit.  Bacterial endocarditis prophylaxis  You will have to take antibiotics for the rest of your life before all dental procedures (even teeth cleanings) to protect your heart valve. Antibiotics are also required before some surgeries. Please check with your cardiologist before scheduling any surgeries. Also, please make sure to tell us if you  have a penicillin allergy as you will require an alternative antibiotic.   If the dressing is still on your incision site when you go home, remove it on the third day after your surgery date. Remove dressing if it begins to fall off, or if it is dirty or damaged before the third day.    Complete by: As directed    Increase activity slowly   Complete by: As directed       Discharge Medications   Allergies as of 05/30/2021       Reactions   Penicillins Rash   Unknown reaction Has patient had a PCN reaction causing immediate rash, facial/tongue/throat swelling, SOB or lightheadedness with hypotension: NO Has patient had a PCN reaction causing severe rash involving mucus membranes or skin necrosis: NO Has patient had a PCN reaction that required hospitalization NO Has patient had a PCN reaction occurring within the last 10 years: NO If all of the above answers are "NO", then may proceed with Cephalosporin use.        Medication List     TAKE these medications    amLODipine 10 MG tablet Commonly known as: NORVASC TAKE 1 TABLET DAILY.   aspirin 81 MG chewable tablet Chew 1 tablet (81 mg total) by mouth daily. Start taking on: May 31, 2021   atorvastatin 20 MG tablet Commonly known as: LIPITOR TAKE 1 TABLET BY MOUTH EVERY DAY **REPLACES SIMVASTATIN**   CINNAMON PO Take 1,000 mg by mouth in the morning and at bedtime.   dorzolamide-timolol 22.3-6.8 MG/ML ophthalmic solution Commonly known as: COSOPT Place 1 drop 2 (two) times daily into both eyes.   Garlic 0076 MG Caps Take 1,000 mg by mouth in the morning and at bedtime.   latanoprost 0.005 % ophthalmic solution Commonly known as: XALATAN Place 1 drop at bedtime into both eyes.   losartan-hydrochlorothiazide 100-25 MG tablet Commonly known as: HYZAAR Take 1 tablet by mouth daily.   metoprolol tartrate 25 MG tablet Commonly known as: LOPRESSOR Take as directed prior to 9/28 CT scan   multivitamin with minerals Tabs tablet Take 1 tablet by mouth daily. One-A-Day Men's   OVER THE COUNTER MEDICATION Take 2 capsules by mouth in the morning and at bedtime. Healthy Beets Root   potassium chloride SA 20 MEQ tablet Commonly known as: KLOR-CON Take 20 mEq by mouth 2 (two) times daily.    spironolactone 25 MG tablet Commonly known as: ALDACTONE Take 1 tablet (25 mg total) by mouth daily.               Discharge Care Instructions  (From admission, onward)           Start     Ordered   05/30/21 0000  If the dressing is still on your incision site when you go home, remove it on the third day after your surgery date. Remove dressing if it begins to fall off, or if it is dirty or damaged before the third day.        05/30/21 1038            Outstanding Labs/Studies   None   Duration of Discharge Encounter   Greater than 30 minutes including physician time.  SignedKathyrn Drown, NP 05/30/2021, 10:38 AM [phone]   I have personally seen and examined this patient. I agree with the assessment and plan as outlined above.  Pt doing well one day post TAVR. BP stable. Groins stable. Tele reviewed. No heart block noted. Labs  reviewed.  D/C home today.   Lauree Chandler 05/30/2021 10:43 AM

## 2021-05-30 NOTE — Plan of Care (Signed)

## 2021-05-30 NOTE — Progress Notes (Signed)
CARDIAC REHAB PHASE I   PRE:  Rate/Rhythm: 58 SB    BP: sitting 174/77    SaO2: 97 RA  MODE:  Ambulation: 430 ft   POST:  Rate/Rhythm: 86 SR    BP: sitting 195/77     SaO2: 97 RA  Pt moving well. BP up now, no meds yet. Discussed restrictions, exercise, diet (DM), and CRPII. Pt receptive. Interested in [place]. Will refer to [place], ACSM 05/30/2021 8:45 AM

## 2021-05-31 ENCOUNTER — Encounter (HOSPITAL_COMMUNITY): Payer: Self-pay | Admitting: Cardiovascular Disease

## 2021-05-31 ENCOUNTER — Telehealth: Payer: Self-pay | Admitting: Physician Assistant

## 2021-05-31 NOTE — Telephone Encounter (Signed)
  [place] VALVE TEAM   Patient contacted regarding discharge from [facility] on 11/2  Patient understands to follow up with provider Nell Range on 11/9 at [place].  Patient understands discharge instructions? yes Patient understands medications and regimen? yes Patient understands to bring all medications to this visit? yes  Angelena Form PA-C  [facility]

## 2021-06-04 MED FILL — Heparin Sodium (Porcine) Inj 1000 Unit/ML: Qty: 1000 | Status: AC

## 2021-06-04 MED FILL — Potassium Chloride Inj 2 mEq/ML: INTRAVENOUS | Qty: 40 | Status: AC

## 2021-06-04 MED FILL — Magnesium Sulfate Inj 50%: INTRAMUSCULAR | Qty: 10 | Status: AC
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
## 2021-06-06 ENCOUNTER — Ambulatory Visit (INDEPENDENT_AMBULATORY_CARE_PROVIDER_SITE_OTHER): Payer: Medicare HMO

## 2021-06-06 ENCOUNTER — Encounter: Payer: Self-pay | Admitting: Physician Assistant

## 2021-06-06 ENCOUNTER — Other Ambulatory Visit: Payer: Self-pay

## 2021-06-06 ENCOUNTER — Ambulatory Visit: Payer: Medicare HMO | Admitting: Physician Assistant

## 2021-06-06 VITALS — BP 122/76 | HR 75 | Ht 68.0 in | Wt 252.0 lb

## 2021-06-06 DIAGNOSIS — I447 Left bundle-branch block, unspecified: Secondary | ICD-10-CM

## 2021-06-06 DIAGNOSIS — R918 Other nonspecific abnormal finding of lung field: Secondary | ICD-10-CM

## 2021-06-06 DIAGNOSIS — I10 Essential (primary) hypertension: Secondary | ICD-10-CM

## 2021-06-06 DIAGNOSIS — I440 Atrioventricular block, first degree: Secondary | ICD-10-CM

## 2021-06-06 DIAGNOSIS — Z952 Presence of prosthetic heart valve: Secondary | ICD-10-CM | POA: Diagnosis not present

## 2021-06-06 DIAGNOSIS — E7800 Pure hypercholesterolemia, unspecified: Secondary | ICD-10-CM

## 2021-06-06 MED ORDER — AZITHROMYCIN 500 MG PO TABS: ORAL_TABLET | ORAL | 6 refills | Status: AC

## 2021-06-06 NOTE — Patient Instructions (Addendum)
Medication Instructions:  Start Azithromycin 500 mg, take 1 tablet by mouth 1 hour prior to dental work and cleanings   *If you need a refill on your cardiac medications before your next appointment, please call your pharmacy*   Lab Work: None ordered   If you have labs (blood work) drawn today and your tests are completely normal, you will receive your results only by: [place] (if you have MyChart) OR A paper copy in the mail If you have any lab test that is abnormal or we need to change your treatment, we will call you to review the results.   Testing/Procedures: A zio monitor was ordered today. Instructions given below.   Follow-Up: Follow up as scheduled    Other Instructions  ZIO AT Long term monitor-Live Telemetry  Your physician has requested you wear a ZIO patch monitor for 14 days.  This is a single patch monitor. [employer] supplies one patch monitor per enrollment. Additional  stickers are not available.  Please do not apply patch if you will be having a Nuclear Stress Test, Echocardiogram, Cardiac CT, MRI,  or Chest Xray during the period you would be wearing the monitor. The patch cannot be worn during  these tests. You cannot remove and re-apply the ZIO AT patch monitor.  Your ZIO patch monitor will be mailed 3 day USPS to your address on file. It may take 3-5 days to  receive your monitor after you have been enrolled.  Once you have received your monitor, please review the enclosed instructions. Your monitor has  already been registered assigning a specific monitor serial # to you.   Billing and Patient Assistance Program information  Bernard Garcia has been supplied with any insurance information on record for billing. [employer] offers a sliding scale Patient Assistance Program for patients without insurance, or whose  insurance does not completely cover the cost of the ZIO patch monitor. You must apply for the  Patient Assistance Program to qualify for the  discounted rate. To apply, call [employer] at [phone],  select option 4, select option 2 , ask to apply for the Patient Assistance Program, (you can request an  interpreter if needed). [employer] will ask your household income and how many people are in your  household. [employer] will quote your out-of-pocket cost based on this information. They will also be able  to set up a 12 month interest free payment plan if needed.  Applying the monitor   Shave hair from upper left chest.  Hold the abrader disc by orange tab. Rub the abrader in 40 strokes over left upper chest as indicated in  your monitor instructions.  Clean area with 4 enclosed alcohol pads. Use all pads to ensure the area is cleaned thoroughly. Let  dry.  Apply patch as indicated in monitor instructions. Patch will be placed under collarbone on left side of  chest with arrow pointing upward.  Rub patch adhesive wings for 2 minutes. Remove the white label marked "1". Remove the white label  marked "2". Rub patch adhesive wings for 2 additional minutes.  While looking in a mirror, press and release button in center of patch. A small green light will flash 3-4  times. This will be your only indicator that the monitor has been turned on.  Do not shower for the first 24 hours. You may shower after the first 24 hours.  Press the button if you feel a symptom. You will hear a small click. Record Date, Time and Symptom  in  the Patient Log.   Starting the Gateway  In your kit there is a Hydrographic surveyor box the size of a cellphone. This is Airline pilot. It transmits all your  recorded data to [facility]. This box must always stay within 10 feet of you. Open the box and push the *  button. There will be a light that blinks orange and then green a few times. When the light stops  blinking, the Gateway is connected to the ZIO patch. Call [employer] at [phone] to confirm your monitor is transmitting.  Returning your monitor  Remove your patch  and place it inside the [place]. In the lower half of the Gateway there is a white  bag with prepaid postage on it. Place Gateway in bag and seal. Mail package back to [place] as soon as  possible. Your physician should have your final report approximately 7 days after you have mailed back  your monitor. Call [place] at [phone] if you have questions regarding your ZIO AT  patch monitor. Call them immediately if you see an orange light blinking on your monitor.  If your monitor falls off in less than 4 days, contact our Monitor department at [phone]. If your  monitor becomes loose or falls off after 4 days call [employer] at [phone] for suggestions on  securing your monitor

## 2021-06-06 NOTE — Progress Notes (Unsigned)
Enrolled patient for a 14 day Zio AT monitor to be mailed to patients home  Dr Croitoru to read 

## 2021-06-07 DIAGNOSIS — R739 Hyperglycemia, unspecified: Secondary | ICD-10-CM | POA: Diagnosis not present

## 2021-06-07 DIAGNOSIS — I10 Essential (primary) hypertension: Secondary | ICD-10-CM | POA: Diagnosis not present

## 2021-06-07 DIAGNOSIS — E785 Hyperlipidemia, unspecified: Secondary | ICD-10-CM | POA: Diagnosis not present

## 2021-06-10 DIAGNOSIS — I447 Left bundle-branch block, unspecified: Secondary | ICD-10-CM | POA: Diagnosis not present

## 2021-06-10 DIAGNOSIS — I440 Atrioventricular block, first degree: Secondary | ICD-10-CM | POA: Diagnosis not present

## 2021-06-11 ENCOUNTER — Telehealth (HOSPITAL_COMMUNITY): Payer: Self-pay

## 2021-06-11 DIAGNOSIS — I447 Left bundle-branch block, unspecified: Secondary | ICD-10-CM | POA: Diagnosis not present

## 2021-06-11 DIAGNOSIS — I440 Atrioventricular block, first degree: Secondary | ICD-10-CM | POA: Diagnosis not present

## 2021-06-11 NOTE — Telephone Encounter (Signed)
Attempted to call patient in regards to Cardiac Rehab - Unable to leave VM, VM box full.

## 2021-06-11 NOTE — Telephone Encounter (Signed)
Will check insurance benefits closer to scheduling and/or into the new year 2023. 

## 2021-06-25 ENCOUNTER — Telehealth (HOSPITAL_COMMUNITY): Payer: Self-pay

## 2021-06-25 ENCOUNTER — Encounter (HOSPITAL_COMMUNITY): Payer: Self-pay

## 2021-06-25 NOTE — Telephone Encounter (Signed)
Pt insurance is active and benefits verified through [facility]. Co-pay $10.00, DED $0.00/$0.00 met, out of pocket $3,900.00/$830.49 met, co-insurance 0%. No pre-authorization required. Passport, 06/25/21 @ 4:06PM, TCY#[id number]

## 2021-06-25 NOTE — Telephone Encounter (Signed)
Attempted to call patient in regards to Cardiac Rehab - unable to leave VM, VM box full. °  °Mailed letter °

## 2021-07-03 ENCOUNTER — Telehealth (HOSPITAL_COMMUNITY): Payer: Self-pay

## 2021-07-03 NOTE — Telephone Encounter (Signed)
Pt called back and he is not interested in the cardiac rehab. Closed referral

## 2021-07-18 ENCOUNTER — Other Ambulatory Visit: Payer: Self-pay

## 2021-07-18 ENCOUNTER — Ambulatory Visit (HOSPITAL_COMMUNITY): Payer: Medicare HMO | Attending: Cardiology

## 2021-07-18 ENCOUNTER — Ambulatory Visit (INDEPENDENT_AMBULATORY_CARE_PROVIDER_SITE_OTHER): Payer: Medicare HMO | Admitting: Physician Assistant

## 2021-07-18 ENCOUNTER — Encounter: Payer: Self-pay | Admitting: Physician Assistant

## 2021-07-18 VITALS — BP 152/78 | HR 52 | Ht 68.0 in | Wt 254.0 lb

## 2021-07-18 DIAGNOSIS — R918 Other nonspecific abnormal finding of lung field: Secondary | ICD-10-CM | POA: Diagnosis not present

## 2021-07-18 DIAGNOSIS — I10 Essential (primary) hypertension: Secondary | ICD-10-CM | POA: Diagnosis not present

## 2021-07-18 DIAGNOSIS — Z952 Presence of prosthetic heart valve: Secondary | ICD-10-CM

## 2021-07-18 DIAGNOSIS — E7800 Pure hypercholesterolemia, unspecified: Secondary | ICD-10-CM

## 2021-07-18 DIAGNOSIS — I4589 Other specified conduction disorders: Secondary | ICD-10-CM

## 2021-07-18 MED ORDER — HYDRALAZINE HCL 25 MG PO TABS: 25.0000 mg | ORAL_TABLET | Freq: Two times a day (BID) | ORAL | 3 refills | Status: DC
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
## 2021-07-18 NOTE — Patient Instructions (Signed)
Medication Instructions:  Start Hydralazine 25 mg twice a day  *If you need a refill on your cardiac medications before your next appointment, please call your pharmacy*   Lab Work: None ordered   If you have labs (blood work) drawn today and your tests are completely normal, you will receive your results only by: [place] (if you have MyChart) OR A paper copy in the mail If you have any lab test that is abnormal or we need to change your treatment, we will call you to review the results.   Testing/Procedures: None ordered    Follow-Up: Follow up as scheduled    Other Instructions None

## 2021-07-19 DIAGNOSIS — R7301 Impaired fasting glucose: Secondary | ICD-10-CM | POA: Diagnosis not present

## 2021-07-19 DIAGNOSIS — I10 Essential (primary) hypertension: Secondary | ICD-10-CM | POA: Diagnosis not present

## 2021-07-19 DIAGNOSIS — E785 Hyperlipidemia, unspecified: Secondary | ICD-10-CM | POA: Diagnosis not present

## 2021-07-19 LAB — ECHOCARDIOGRAM COMPLETE
AR max vel: 1.59 cm2
AV Area VTI: 1.92 cm2
AV Area mean vel: 1.53 cm2
AV Mean grad: 17 mmHg
AV Peak grad: 30.5 mmHg
Ao pk vel: 2.76 m/s
Area-P 1/2: 2.7 cm2
S' Lateral: 3 cm

## 2021-08-07 DIAGNOSIS — H25013 Cortical age-related cataract, bilateral: Secondary | ICD-10-CM | POA: Diagnosis not present

## 2021-08-07 DIAGNOSIS — H2513 Age-related nuclear cataract, bilateral: Secondary | ICD-10-CM | POA: Diagnosis not present

## 2021-08-07 DIAGNOSIS — H524 Presbyopia: Secondary | ICD-10-CM | POA: Diagnosis not present

## 2021-08-07 DIAGNOSIS — H401133 Primary open-angle glaucoma, bilateral, severe stage: Secondary | ICD-10-CM | POA: Diagnosis not present

## 2021-08-22 DIAGNOSIS — Z0100 Encounter for examination of eyes and vision without abnormal findings: Secondary | ICD-10-CM | POA: Diagnosis not present

## 2021-10-16 DIAGNOSIS — I10 Essential (primary) hypertension: Secondary | ICD-10-CM | POA: Diagnosis not present

## 2021-10-16 DIAGNOSIS — R7301 Impaired fasting glucose: Secondary | ICD-10-CM | POA: Diagnosis not present

## 2021-10-16 DIAGNOSIS — E785 Hyperlipidemia, unspecified: Secondary | ICD-10-CM | POA: Diagnosis not present

## 2021-10-30 IMAGING — CR DG CHEST 2V
2 series · 2 of 2 positions shown · non-contrast
Comparison: 02/22/2019

CLINICAL DATA: Pre admission for TAVR.

EXAM:
CHEST - 2 VIEW

[w chest pa]
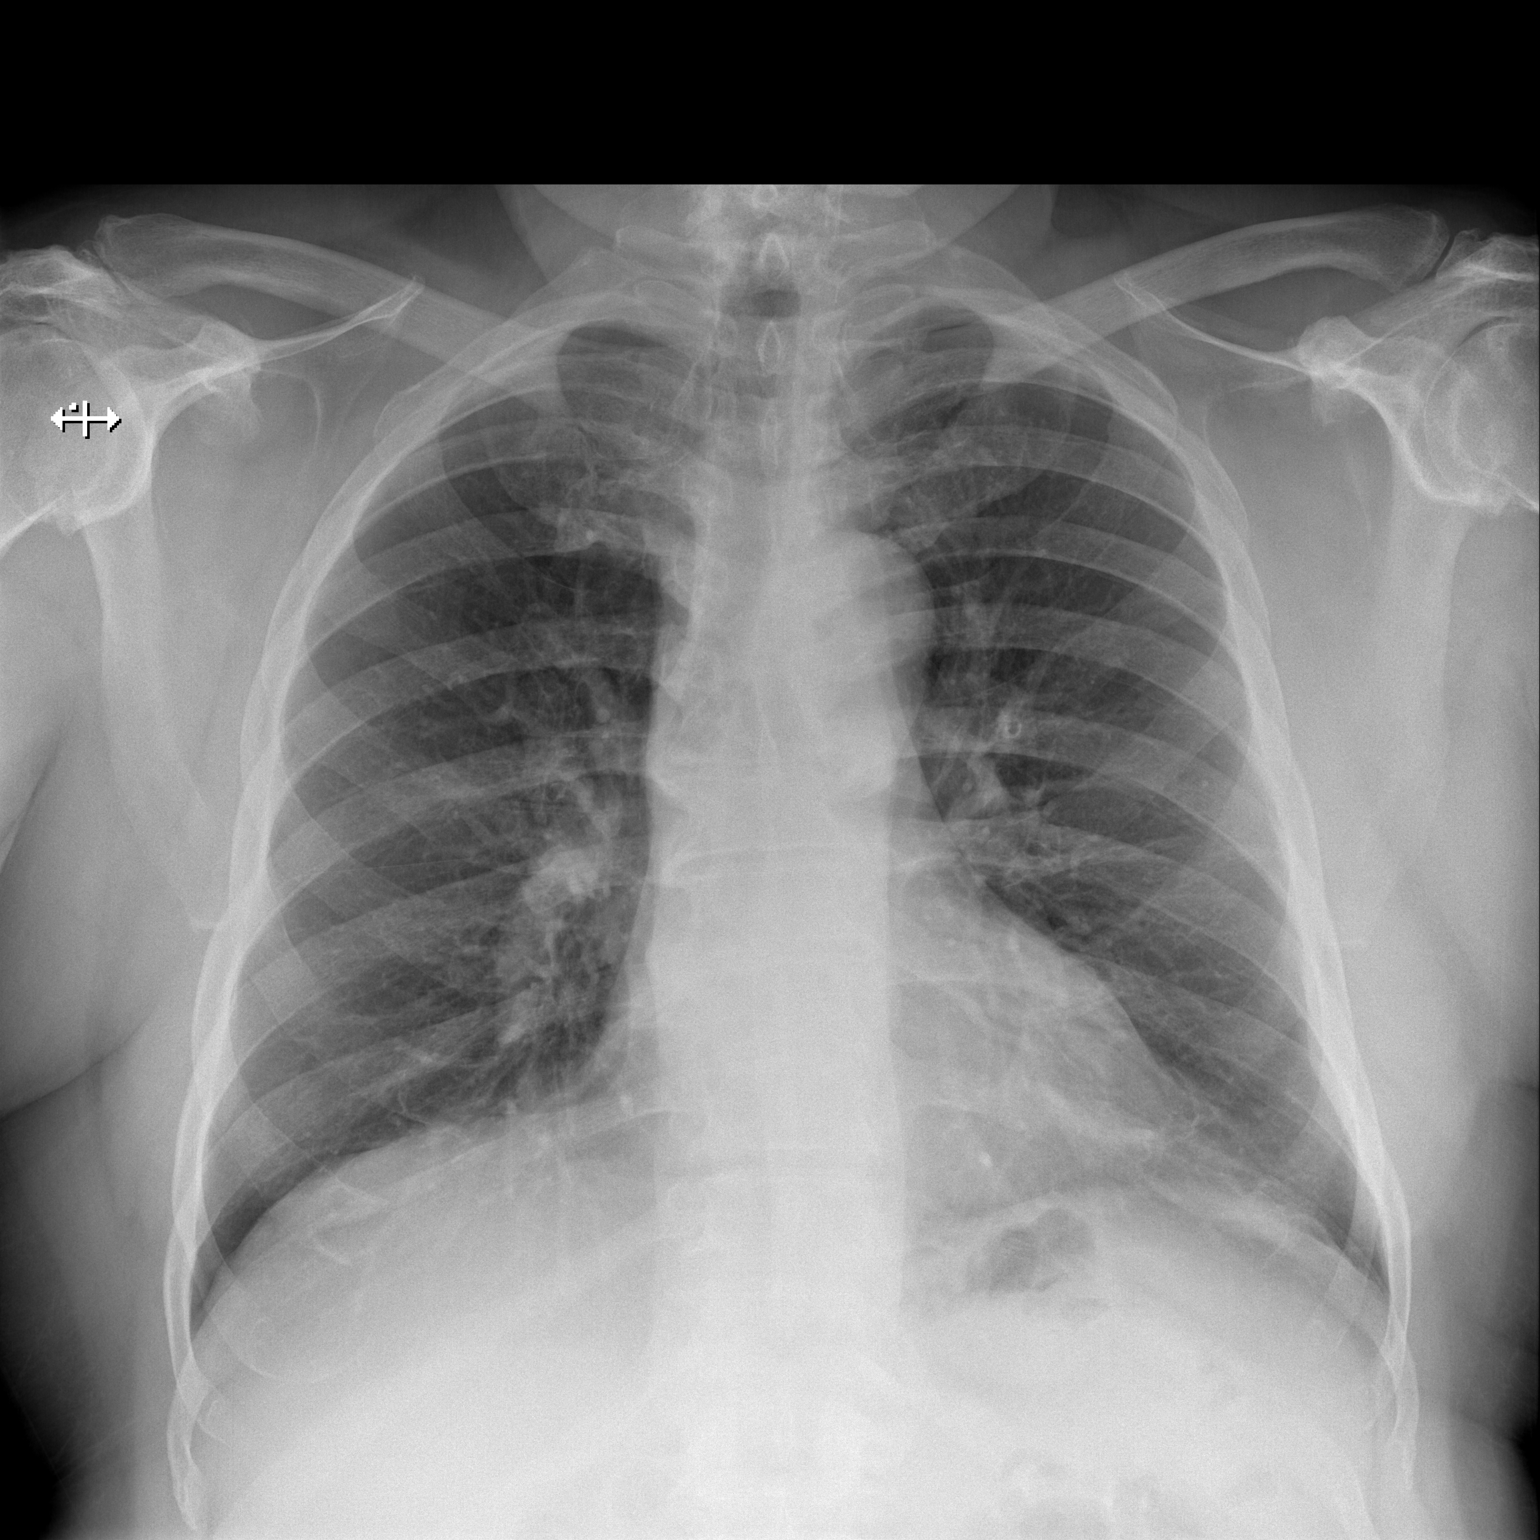

[w chest lat]
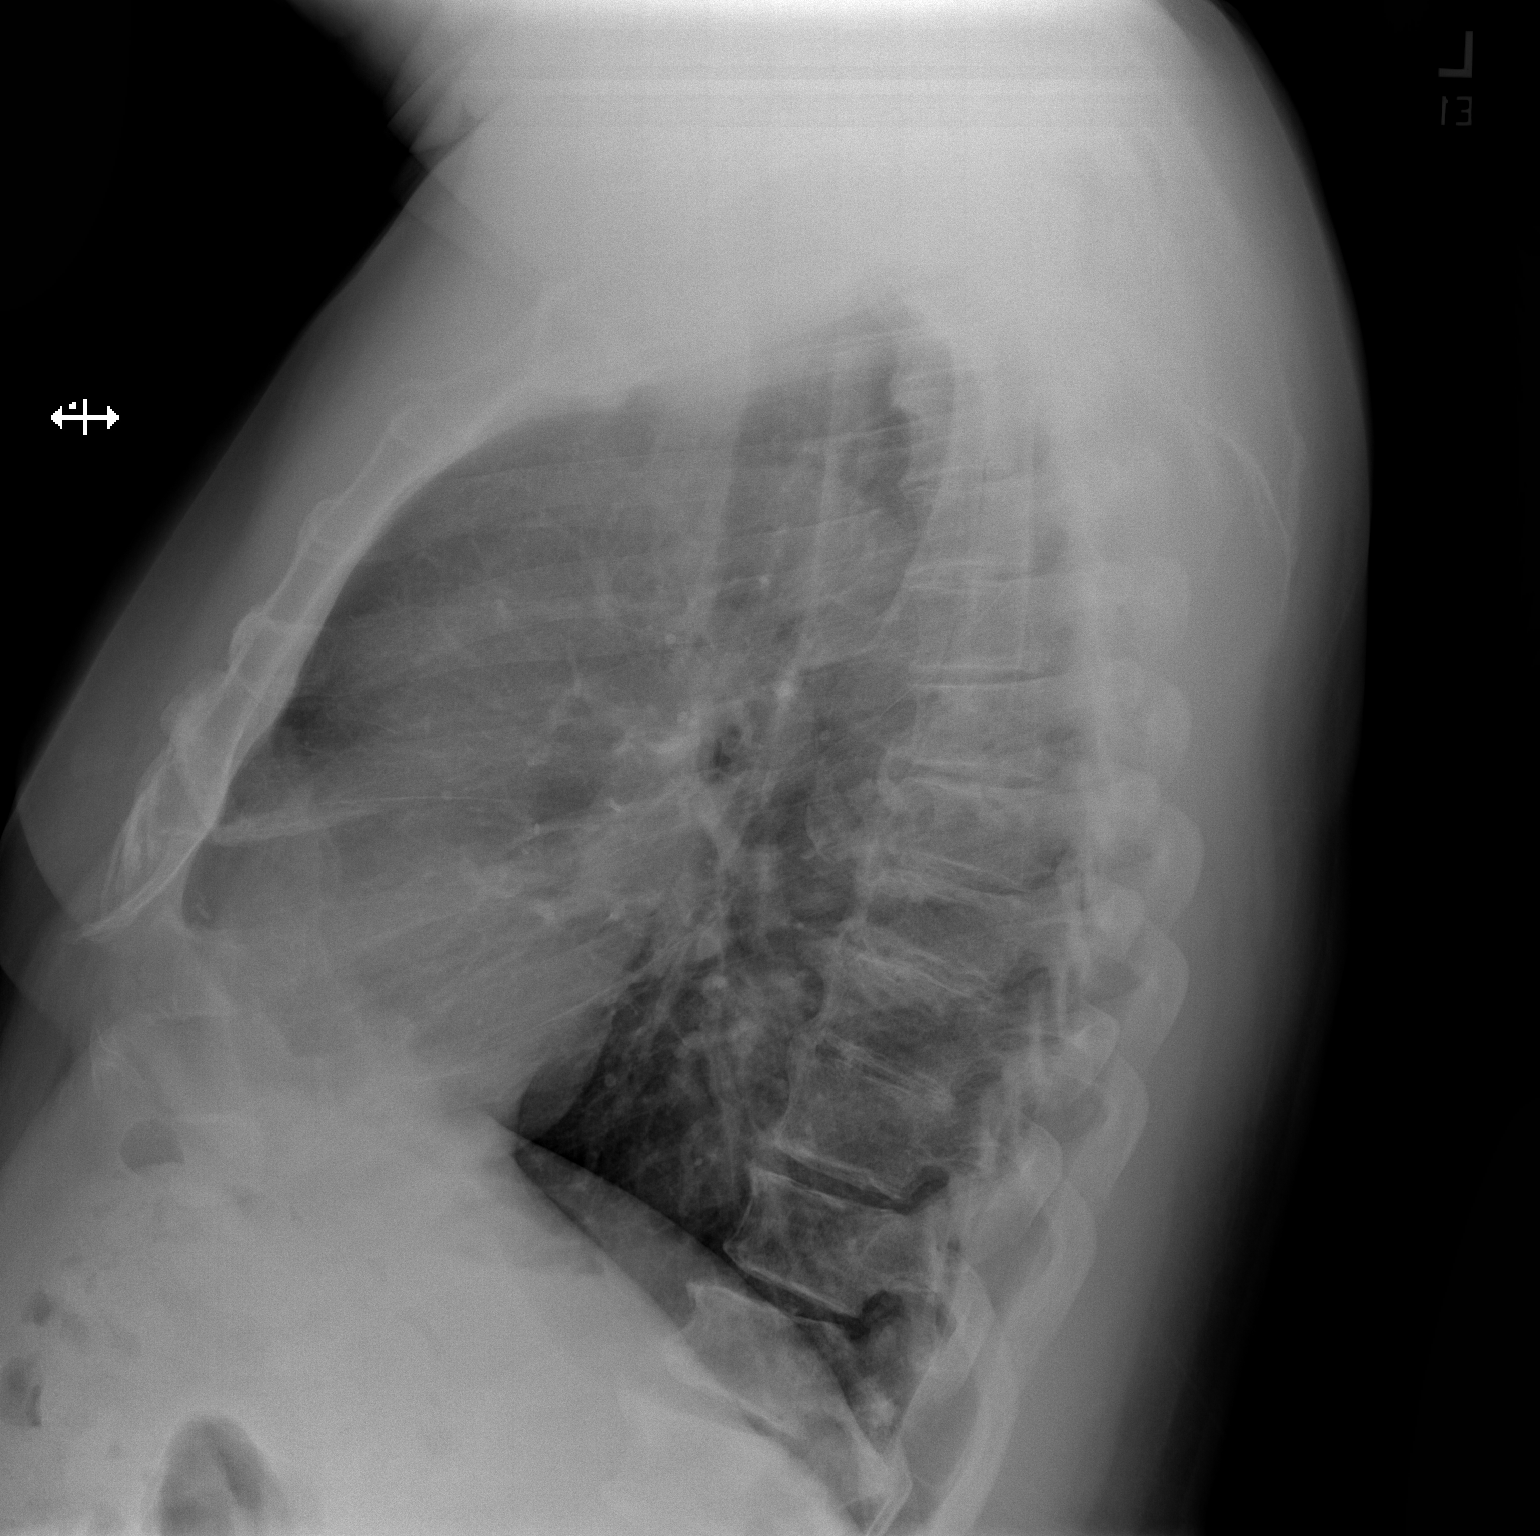

[2 of 2 positions shown; findings below may reference images not displayed]

FINDINGS: Lungs are adequately inflated without focal airspace consolidation
or effusion. Cardiomediastinal silhouette and remainder of the exam
is unchanged.
IMPRESSION: No active cardiopulmonary disease.

## 2021-11-20 ENCOUNTER — Encounter: Payer: Self-pay | Admitting: Cardiovascular Disease

## 2021-11-20 ENCOUNTER — Ambulatory Visit: Payer: Medicare HMO | Admitting: Cardiovascular Disease

## 2021-11-20 VITALS — BP 137/68 | HR 67 | Ht 67.0 in | Wt 247.6 lb

## 2021-11-20 DIAGNOSIS — R001 Bradycardia, unspecified: Secondary | ICD-10-CM

## 2021-11-20 DIAGNOSIS — Z952 Presence of prosthetic heart valve: Secondary | ICD-10-CM

## 2021-11-20 DIAGNOSIS — E785 Hyperlipidemia, unspecified: Secondary | ICD-10-CM | POA: Diagnosis not present

## 2021-11-20 DIAGNOSIS — I493 Ventricular premature depolarization: Secondary | ICD-10-CM

## 2021-11-20 DIAGNOSIS — I10 Essential (primary) hypertension: Secondary | ICD-10-CM | POA: Diagnosis not present

## 2021-11-20 NOTE — Patient Instructions (Signed)
Medication Instructions:  No changes *If you need a refill on your cardiac medications before your next appointment, please call your pharmacy*   Lab Work: None ordered If you have labs (blood work) drawn today and your tests are completely normal, you will receive your results only by: MyChart Message (if you have MyChart) OR A paper copy in the mail If you have any lab test that is abnormal or we need to change your treatment, we will call you to review the results.   Testing/Procedures: None ordered   Follow-Up: At [facility], you and your health needs are our priority.  As part of our continuing mission to provide you with exceptional heart care, we have created designated Provider Care Teams.  These Care Teams include your primary Cardiologist (physician) and Advanced Practice Providers (APPs -  Physician Assistants and Nurse Practitioners) who all work together to provide you with the care you need, when you need it.  We recommend signing up for the patient portal called "MyChart".  Sign up information is provided on this After Visit Summary.  MyChart is used to connect with patients for Virtual Visits (Telemedicine).  Patients are able to view lab/test results, encounter notes, upcoming appointments, etc.  Non-urgent messages can be sent to your provider as well.   To learn more about what you can do with MyChart, go to [email or web address].    Your next appointment:   12 month(s)  The format for your next appointment:   In Person  Provider:   Dr. Croitoru  Important Information About Sugar       
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
## 2021-11-22 ENCOUNTER — Encounter: Payer: Self-pay | Admitting: Cardiovascular Disease

## 2021-12-05 DIAGNOSIS — H401133 Primary open-angle glaucoma, bilateral, severe stage: Secondary | ICD-10-CM | POA: Diagnosis not present

## 2022-01-21 DIAGNOSIS — Z125 Encounter for screening for malignant neoplasm of prostate: Secondary | ICD-10-CM | POA: Diagnosis not present

## 2022-01-21 DIAGNOSIS — R7301 Impaired fasting glucose: Secondary | ICD-10-CM | POA: Diagnosis not present

## 2022-01-21 DIAGNOSIS — E785 Hyperlipidemia, unspecified: Secondary | ICD-10-CM | POA: Diagnosis not present

## 2022-01-21 DIAGNOSIS — R7989 Other specified abnormal findings of blood chemistry: Secondary | ICD-10-CM | POA: Diagnosis not present

## 2022-01-21 DIAGNOSIS — I10 Essential (primary) hypertension: Secondary | ICD-10-CM | POA: Diagnosis not present

## 2022-01-21 DIAGNOSIS — D649 Anemia, unspecified: Secondary | ICD-10-CM | POA: Diagnosis not present

## 2022-01-21 DIAGNOSIS — Z0000 Encounter for general adult medical examination without abnormal findings: Secondary | ICD-10-CM | POA: Diagnosis not present

## 2022-01-23 DIAGNOSIS — I10 Essential (primary) hypertension: Secondary | ICD-10-CM | POA: Diagnosis not present

## 2022-01-23 DIAGNOSIS — E785 Hyperlipidemia, unspecified: Secondary | ICD-10-CM | POA: Diagnosis not present

## 2022-01-23 DIAGNOSIS — R7301 Impaired fasting glucose: Secondary | ICD-10-CM | POA: Diagnosis not present

## 2022-01-28 DIAGNOSIS — Z1331 Encounter for screening for depression: Secondary | ICD-10-CM | POA: Diagnosis not present

## 2022-01-28 DIAGNOSIS — Z0000 Encounter for general adult medical examination without abnormal findings: Secondary | ICD-10-CM | POA: Diagnosis not present

## 2022-01-28 DIAGNOSIS — N529 Male erectile dysfunction, unspecified: Secondary | ICD-10-CM | POA: Diagnosis not present

## 2022-01-28 DIAGNOSIS — E785 Hyperlipidemia, unspecified: Secondary | ICD-10-CM | POA: Diagnosis not present

## 2022-01-28 DIAGNOSIS — Z1339 Encounter for screening examination for other mental health and behavioral disorders: Secondary | ICD-10-CM | POA: Diagnosis not present

## 2022-01-28 DIAGNOSIS — Z952 Presence of prosthetic heart valve: Secondary | ICD-10-CM | POA: Diagnosis not present

## 2022-01-28 DIAGNOSIS — R82998 Other abnormal findings in urine: Secondary | ICD-10-CM | POA: Diagnosis not present

## 2022-01-28 DIAGNOSIS — R7301 Impaired fasting glucose: Secondary | ICD-10-CM | POA: Diagnosis not present

## 2022-01-28 DIAGNOSIS — I10 Essential (primary) hypertension: Secondary | ICD-10-CM | POA: Diagnosis not present

## 2022-02-20 ENCOUNTER — Other Ambulatory Visit: Payer: Self-pay | Admitting: Physician Assistant

## 2022-02-20 DIAGNOSIS — Z952 Presence of prosthetic heart valve: Secondary | ICD-10-CM

## 2022-04-10 DIAGNOSIS — H04123 Dry eye syndrome of bilateral lacrimal glands: Secondary | ICD-10-CM | POA: Diagnosis not present

## 2022-04-10 DIAGNOSIS — H401133 Primary open-angle glaucoma, bilateral, severe stage: Secondary | ICD-10-CM | POA: Diagnosis not present
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
## 2022-04-26 ENCOUNTER — Ambulatory Visit (HOSPITAL_COMMUNITY): Payer: Medicare HMO | Attending: Physician Assistant

## 2022-04-26 ENCOUNTER — Ambulatory Visit: Payer: Medicare HMO | Admitting: Physician Assistant

## 2022-04-26 VITALS — BP 128/78 | HR 48 | Ht 67.0 in | Wt 248.0 lb

## 2022-04-26 DIAGNOSIS — R918 Other nonspecific abnormal finding of lung field: Secondary | ICD-10-CM | POA: Diagnosis not present

## 2022-04-26 DIAGNOSIS — I10 Essential (primary) hypertension: Secondary | ICD-10-CM

## 2022-04-26 DIAGNOSIS — I4589 Other specified conduction disorders: Secondary | ICD-10-CM | POA: Diagnosis not present

## 2022-04-26 DIAGNOSIS — I493 Ventricular premature depolarization: Secondary | ICD-10-CM | POA: Insufficient documentation

## 2022-04-26 DIAGNOSIS — Z952 Presence of prosthetic heart valve: Secondary | ICD-10-CM | POA: Insufficient documentation

## 2022-04-26 DIAGNOSIS — E785 Hyperlipidemia, unspecified: Secondary | ICD-10-CM

## 2022-04-26 LAB — ECHOCARDIOGRAM COMPLETE
AR max vel: 3.3 cm2
AV Area VTI: 3.45 cm2
AV Area mean vel: 3.05 cm2
AV Mean grad: 8.2 mmHg
AV Peak grad: 15.9 mmHg
Ao pk vel: 1.99 m/s
Area-P 1/2: 2.89 cm2
S' Lateral: 2.9 cm

## 2022-04-26 NOTE — Patient Instructions (Addendum)
Medication Instructions:  Your physician recommends that you continue on your current medications as directed. Please refer to the Current Medication list given to you today.   *If you need a refill on your cardiac medications before your next appointment, please call your pharmacy*   Lab Work: None ordered   If you have labs (blood work) drawn today and your tests are completely normal, you will receive your results only by: [place] (if you have MyChart) OR A paper copy in the mail If you have any lab test that is abnormal or we need to change your treatment, we will call you to review the results.   Testing/Procedures: Your physician has recommended that you a Chest CT    Follow-Up: Continue to follow up with Dr. Sallyanne Kuster as planned   Other Instructions   Important Information About Sugar

## 2022-05-02 ENCOUNTER — Ambulatory Visit (HOSPITAL_COMMUNITY)
Admission: RE | Admit: 2022-05-02 | Discharge: 2022-05-02 | Disposition: A | Discharge status: Home / Self Care | Payer: Medicare HMO | Source: Ambulatory Visit | Attending: Physician Assistant | Admitting: Physician Assistant

## 2022-05-02 DIAGNOSIS — R918 Other nonspecific abnormal finding of lung field: Secondary | ICD-10-CM | POA: Insufficient documentation

## 2022-05-02 DIAGNOSIS — R911 Solitary pulmonary nodule: Secondary | ICD-10-CM | POA: Diagnosis not present

## 2022-05-03 NOTE — Progress Notes (Signed)
Overall nothing looks worrisome, but they will need follow up over time. Can we refer him to the pulm nodule clinic

## 2022-05-07 ENCOUNTER — Telehealth: Payer: Self-pay

## 2022-05-07 DIAGNOSIS — R918 Other nonspecific abnormal finding of lung field: Secondary | ICD-10-CM

## 2022-05-07 NOTE — Telephone Encounter (Signed)
-----   Message from Eileen Stanford, [place] sent at 05/03/2022  8:26 PM EDT ----- Overall nothing looks worrisome, but they will need follow up over time. Can we refer him to the pulm nodule clinic

## 2022-05-07 NOTE — Addendum Note (Signed)
Addended by: Precious Gilding on: 05/07/2022 01:38 PM   Modules accepted: Orders

## 2022-05-07 NOTE — Telephone Encounter (Signed)
The patient has been notified of the result and verbalized understanding.  All questions (if any) were answered. Precious Gilding, RN 05/07/2022 11:38 AM

## 2022-05-07 NOTE — Telephone Encounter (Signed)
Updated referral to go to [facility].

## 2022-05-13 DIAGNOSIS — R42 Dizziness and giddiness: Secondary | ICD-10-CM | POA: Diagnosis not present

## 2022-05-13 DIAGNOSIS — Z952 Presence of prosthetic heart valve: Secondary | ICD-10-CM | POA: Diagnosis not present

## 2022-05-13 DIAGNOSIS — I10 Essential (primary) hypertension: Secondary | ICD-10-CM | POA: Diagnosis not present

## 2022-05-13 DIAGNOSIS — R739 Hyperglycemia, unspecified: Secondary | ICD-10-CM | POA: Diagnosis not present

## 2022-05-21 ENCOUNTER — Encounter: Payer: Self-pay | Admitting: Emergency Medicine

## 2022-05-21 ENCOUNTER — Ambulatory Visit: Payer: Medicare HMO | Admitting: Emergency Medicine

## 2022-05-21 DIAGNOSIS — R918 Other nonspecific abnormal finding of lung field: Secondary | ICD-10-CM
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
## 2022-05-21 NOTE — Assessment & Plan Note (Signed)
2 small 5 mm pulmonary nodules, groundglass, in a patient with moderate to low risk for malignancy.  Only about 10 pack years tobacco use.  Etiology unclear.  No symptoms to suggest an infectious process.  No history of autoimmune disease.  We typically follow groundglass nodules for 5 years to ensure stability.  We will plan to repeat his CT scan of the chest in October 2024.  If there is an interval change in his nodular disease then we will decide at that time whether advanced diagnostics are indicated.

## 2022-05-21 NOTE — Patient Instructions (Signed)
We reviewed your CT scans of the chest today. We will plan to repeat a CT scan of your chest in October 2024. Follow Dr. Lamonte Sakai in October 2024 after your scan so we can review together.

## 2022-05-21 NOTE — Addendum Note (Signed)
Addended by: Loma Sousa on: 05/21/2022 03:08 PM   Modules accepted: Orders

## 2022-05-22 ENCOUNTER — Other Ambulatory Visit: Payer: Self-pay

## 2022-05-22 MED ORDER — SPIRONOLACTONE 25 MG PO TABS: 25.0000 mg | ORAL_TABLET | Freq: Every day | ORAL | 2 refills | Status: DC

## 2022-06-04 DIAGNOSIS — R7301 Impaired fasting glucose: Secondary | ICD-10-CM | POA: Diagnosis not present

## 2022-06-04 DIAGNOSIS — I10 Essential (primary) hypertension: Secondary | ICD-10-CM | POA: Diagnosis not present

## 2022-06-04 DIAGNOSIS — E785 Hyperlipidemia, unspecified: Secondary | ICD-10-CM | POA: Diagnosis not present

## 2022-06-07 ENCOUNTER — Other Ambulatory Visit: Payer: Self-pay | Admitting: Physician Assistant

## 2022-07-31 ENCOUNTER — Other Ambulatory Visit: Payer: Self-pay

## 2022-08-01 DIAGNOSIS — R0683 Snoring: Secondary | ICD-10-CM | POA: Diagnosis not present

## 2022-08-01 DIAGNOSIS — R7301 Impaired fasting glucose: Secondary | ICD-10-CM | POA: Diagnosis not present

## 2022-08-01 DIAGNOSIS — I10 Essential (primary) hypertension: Secondary | ICD-10-CM | POA: Diagnosis not present

## 2022-08-01 DIAGNOSIS — E785 Hyperlipidemia, unspecified: Secondary | ICD-10-CM | POA: Diagnosis not present

## 2022-08-20 DIAGNOSIS — H5213 Myopia, bilateral: Secondary | ICD-10-CM | POA: Diagnosis not present

## 2022-08-20 DIAGNOSIS — H35033 Hypertensive retinopathy, bilateral: Secondary | ICD-10-CM | POA: Diagnosis not present

## 2022-08-20 DIAGNOSIS — H401133 Primary open-angle glaucoma, bilateral, severe stage: Secondary | ICD-10-CM | POA: Diagnosis not present

## 2022-08-20 DIAGNOSIS — H2513 Age-related nuclear cataract, bilateral: Secondary | ICD-10-CM | POA: Diagnosis not present

## 2022-08-20 DIAGNOSIS — H25013 Cortical age-related cataract, bilateral: Secondary | ICD-10-CM | POA: Diagnosis not present

## 2022-08-20 DIAGNOSIS — H524 Presbyopia: Secondary | ICD-10-CM | POA: Diagnosis not present

## 2022-08-20 DIAGNOSIS — H43813 Vitreous degeneration, bilateral: Secondary | ICD-10-CM | POA: Diagnosis not present

## 2022-08-27 ENCOUNTER — Other Ambulatory Visit: Payer: Self-pay | Admitting: Cardiology

## 2022-08-27 NOTE — Telephone Encounter (Signed)
This is Dr. Croitoru's pt 

## 2022-08-29 ENCOUNTER — Other Ambulatory Visit: Payer: Self-pay

## 2022-08-29 MED ORDER — ASPIRIN 81 MG PO CHEW: CHEWABLE_TABLET | ORAL | 3 refills | Status: AC

## 2022-11-08 ENCOUNTER — Other Ambulatory Visit: Payer: Self-pay

## 2022-11-08 MED ORDER — SPIRONOLACTONE 25 MG PO TABS: 25.0000 mg | ORAL_TABLET | Freq: Every day | ORAL | 1 refills | Status: DC

## 2022-11-08 MED ORDER — HYDRALAZINE HCL 25 MG PO TABS: 25.0000 mg | ORAL_TABLET | Freq: Two times a day (BID) | ORAL | 1 refills | Status: DC

## 2023-01-06 DIAGNOSIS — H2513 Age-related nuclear cataract, bilateral: Secondary | ICD-10-CM | POA: Diagnosis not present

## 2023-01-06 DIAGNOSIS — H401133 Primary open-angle glaucoma, bilateral, severe stage: Secondary | ICD-10-CM | POA: Diagnosis not present

## 2023-04-03 DIAGNOSIS — R7301 Impaired fasting glucose: Secondary | ICD-10-CM | POA: Diagnosis not present

## 2023-04-03 DIAGNOSIS — E785 Hyperlipidemia, unspecified: Secondary | ICD-10-CM | POA: Diagnosis not present

## 2023-04-03 DIAGNOSIS — D649 Anemia, unspecified: Secondary | ICD-10-CM | POA: Diagnosis not present

## 2023-04-03 DIAGNOSIS — Z125 Encounter for screening for malignant neoplasm of prostate: Secondary | ICD-10-CM | POA: Diagnosis not present

## 2023-04-03 DIAGNOSIS — I10 Essential (primary) hypertension: Secondary | ICD-10-CM | POA: Diagnosis not present

## 2023-04-06 ENCOUNTER — Other Ambulatory Visit: Payer: Self-pay | Admitting: Physician Assistant

## 2023-04-10 DIAGNOSIS — Z952 Presence of prosthetic heart valve: Secondary | ICD-10-CM | POA: Diagnosis not present

## 2023-04-10 DIAGNOSIS — Z1331 Encounter for screening for depression: Secondary | ICD-10-CM | POA: Diagnosis not present

## 2023-04-10 DIAGNOSIS — R918 Other nonspecific abnormal finding of lung field: Secondary | ICD-10-CM | POA: Diagnosis not present

## 2023-04-10 DIAGNOSIS — R7301 Impaired fasting glucose: Secondary | ICD-10-CM | POA: Diagnosis not present

## 2023-04-10 DIAGNOSIS — Z1339 Encounter for screening examination for other mental health and behavioral disorders: Secondary | ICD-10-CM | POA: Diagnosis not present

## 2023-04-10 DIAGNOSIS — R0989 Other specified symptoms and signs involving the circulatory and respiratory systems: Secondary | ICD-10-CM | POA: Diagnosis not present

## 2023-04-10 DIAGNOSIS — I10 Essential (primary) hypertension: Secondary | ICD-10-CM | POA: Diagnosis not present

## 2023-04-10 DIAGNOSIS — E785 Hyperlipidemia, unspecified: Secondary | ICD-10-CM | POA: Diagnosis not present

## 2023-04-10 DIAGNOSIS — Z0000 Encounter for general adult medical examination without abnormal findings: Secondary | ICD-10-CM | POA: Diagnosis not present

## 2023-04-10 DIAGNOSIS — R82998 Other abnormal findings in urine: Secondary | ICD-10-CM | POA: Diagnosis not present

## 2023-04-11 ENCOUNTER — Encounter: Payer: Self-pay | Admitting: Emergency Medicine

## 2023-04-23 ENCOUNTER — Other Ambulatory Visit (HOSPITAL_COMMUNITY): Payer: Self-pay | Admitting: Internal Medicine

## 2023-04-23 DIAGNOSIS — R0989 Other specified symptoms and signs involving the circulatory and respiratory systems: Secondary | ICD-10-CM

## 2023-04-24 ENCOUNTER — Ambulatory Visit (HOSPITAL_COMMUNITY)
Admission: RE | Admit: 2023-04-24 | Discharge: 2023-04-24 | Disposition: A | Discharge status: Home / Self Care | Payer: Medicare HMO | Source: Ambulatory Visit | Attending: Vascular Surgery | Admitting: Vascular Surgery

## 2023-04-24 DIAGNOSIS — R0989 Other specified symptoms and signs involving the circulatory and respiratory systems: Secondary | ICD-10-CM | POA: Diagnosis not present

## 2023-05-07 DIAGNOSIS — H401133 Primary open-angle glaucoma, bilateral, severe stage: Secondary | ICD-10-CM | POA: Diagnosis not present

## 2023-05-14 ENCOUNTER — Ambulatory Visit
Admission: RE | Admit: 2023-05-14 | Discharge: 2023-05-14 | Disposition: A | Discharge status: Home / Self Care | Payer: Medicare HMO | Source: Ambulatory Visit | Attending: Emergency Medicine | Admitting: Emergency Medicine

## 2023-05-14 DIAGNOSIS — I700 Atherosclerosis of aorta: Secondary | ICD-10-CM | POA: Diagnosis not present

## 2023-05-14 DIAGNOSIS — R918 Other nonspecific abnormal finding of lung field: Secondary | ICD-10-CM

## 2023-05-30 ENCOUNTER — Other Ambulatory Visit: Payer: Self-pay | Admitting: Physician Assistant

## 2023-06-11 DIAGNOSIS — H401133 Primary open-angle glaucoma, bilateral, severe stage: Secondary | ICD-10-CM | POA: Diagnosis not present

## 2023-06-18 ENCOUNTER — Encounter: Payer: Self-pay | Admitting: Emergency Medicine

## 2023-06-20 ENCOUNTER — Other Ambulatory Visit: Payer: Self-pay | Admitting: Cardiovascular Disease
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
## 2023-06-23 ENCOUNTER — Encounter: Payer: Self-pay | Admitting: Cardiovascular Disease

## 2023-06-23 ENCOUNTER — Ambulatory Visit: Payer: Medicare HMO | Attending: Cardiovascular Disease | Admitting: Cardiovascular Disease

## 2023-06-23 VITALS — BP 169/62 | HR 59 | Ht 67.0 in | Wt 250.6 lb

## 2023-06-23 DIAGNOSIS — R001 Bradycardia, unspecified: Secondary | ICD-10-CM | POA: Diagnosis not present

## 2023-06-23 DIAGNOSIS — E7800 Pure hypercholesterolemia, unspecified: Secondary | ICD-10-CM | POA: Diagnosis not present

## 2023-06-23 DIAGNOSIS — I10 Essential (primary) hypertension: Secondary | ICD-10-CM

## 2023-06-23 DIAGNOSIS — Z952 Presence of prosthetic heart valve: Secondary | ICD-10-CM

## 2023-06-23 DIAGNOSIS — I493 Ventricular premature depolarization: Secondary | ICD-10-CM | POA: Diagnosis not present

## 2023-06-23 NOTE — Patient Instructions (Signed)
Medication Instructions:  No changes *If you need a refill on your cardiac medications before your next appointment, please call your pharmacy*  Monitor your BP at home. Keep a log and sent to [place] in one week  Testing/Procedures: Your physician has requested that you have an echocardiogram. Echocardiography is a painless test that uses sound waves to create images of your heart. It provides your doctor with information about the size and shape of your heart and how well your heart's chambers and valves are working. This procedure takes approximately one hour. There are no restrictions for this procedure. Please do NOT wear cologne, perfume, aftershave, or lotions (deodorant is allowed). Please arrive 15 minutes prior to your appointment time.  Please note: We ask at that you not bring children with you during ultrasound (echo/ vascular) testing. Due to room size and safety concerns, children are not allowed in the ultrasound rooms during exams. Our front office staff cannot provide observation of children in our lobby area while testing is being conducted. An adult accompanying a patient to their appointment will only be allowed in the ultrasound room at the discretion of the ultrasound technician under special circumstances. We apologize for any inconvenience.    Follow-Up: At [facility], you and your health needs are our priority.  As part of our continuing mission to provide you with exceptional heart care, we have created designated Provider Care Teams.  These Care Teams include your primary Cardiologist (physician) and Advanced Practice Providers (APPs -  Physician Assistants and Nurse Practitioners) who all work together to provide you with the care you need, when you need it.  We recommend signing up for the patient portal called "MyChart".  Sign up information is provided on this After Visit Summary.  MyChart is used to connect with patients for Virtual Visits (Telemedicine).   Patients are able to view lab/test results, encounter notes, upcoming appointments, etc.  Non-urgent messages can be sent to your provider as well.   To learn more about what you can do with MyChart, go to [email or web address].    Your next appointment:   1 year(s)  Provider:   Dr Royann Shivers

## 2023-07-12 ENCOUNTER — Other Ambulatory Visit: Payer: Self-pay | Admitting: Cardiovascular Disease

## 2023-07-29 ENCOUNTER — Ambulatory Visit (HOSPITAL_COMMUNITY): Payer: Medicare HMO | Attending: Cardiovascular Disease

## 2023-07-29 DIAGNOSIS — Z952 Presence of prosthetic heart valve: Secondary | ICD-10-CM | POA: Insufficient documentation

## 2023-07-29 LAB — ECHOCARDIOGRAM COMPLETE
AR max vel: 1.95 cm2
AV Area VTI: 2 cm2
AV Area mean vel: 1.98 cm2
AV Mean grad: 17 mm[Hg]
AV Peak grad: 33.1 mm[Hg]
Ao pk vel: 2.88 m/s
Area-P 1/2: 2.45 cm2
S' Lateral: 2.9 cm

## 2023-08-24 ENCOUNTER — Other Ambulatory Visit: Payer: Self-pay | Admitting: Physician Assistant

## 2023-10-21 DIAGNOSIS — Z952 Presence of prosthetic heart valve: Secondary | ICD-10-CM | POA: Diagnosis not present

## 2023-10-21 DIAGNOSIS — E785 Hyperlipidemia, unspecified: Secondary | ICD-10-CM | POA: Diagnosis not present

## 2023-10-21 DIAGNOSIS — R739 Hyperglycemia, unspecified: Secondary | ICD-10-CM | POA: Diagnosis not present

## 2023-10-21 DIAGNOSIS — R7301 Impaired fasting glucose: Secondary | ICD-10-CM | POA: Diagnosis not present

## 2023-10-21 DIAGNOSIS — I10 Essential (primary) hypertension: Secondary | ICD-10-CM | POA: Diagnosis not present

## 2023-12-30 DIAGNOSIS — Z952 Presence of prosthetic heart valve: Secondary | ICD-10-CM | POA: Diagnosis not present

## 2023-12-30 DIAGNOSIS — R42 Dizziness and giddiness: Secondary | ICD-10-CM | POA: Diagnosis not present

## 2023-12-30 DIAGNOSIS — Z1389 Encounter for screening for other disorder: Secondary | ICD-10-CM | POA: Diagnosis not present

## 2023-12-30 DIAGNOSIS — R5383 Other fatigue: Secondary | ICD-10-CM | POA: Diagnosis not present

## 2023-12-30 DIAGNOSIS — I493 Ventricular premature depolarization: Secondary | ICD-10-CM | POA: Diagnosis not present

## 2023-12-30 DIAGNOSIS — I10 Essential (primary) hypertension: Secondary | ICD-10-CM | POA: Diagnosis not present

## 2024-01-28 ENCOUNTER — Ambulatory Visit: Attending: Nurse Practitioner | Admitting: Nurse Practitioner

## 2024-01-28 ENCOUNTER — Encounter: Payer: Self-pay | Admitting: Nurse Practitioner

## 2024-01-28 VITALS — BP 162/68 | HR 78 | Ht 67.0 in | Wt 251.4 lb

## 2024-01-28 DIAGNOSIS — E785 Hyperlipidemia, unspecified: Secondary | ICD-10-CM

## 2024-01-28 DIAGNOSIS — I10 Essential (primary) hypertension: Secondary | ICD-10-CM | POA: Diagnosis not present

## 2024-01-28 DIAGNOSIS — I6523 Occlusion and stenosis of bilateral carotid arteries: Secondary | ICD-10-CM | POA: Diagnosis not present

## 2024-01-28 DIAGNOSIS — Z952 Presence of prosthetic heart valve: Secondary | ICD-10-CM | POA: Diagnosis not present

## 2024-01-28 DIAGNOSIS — I350 Nonrheumatic aortic (valve) stenosis: Secondary | ICD-10-CM

## 2024-01-28 DIAGNOSIS — I4589 Other specified conduction disorders: Secondary | ICD-10-CM

## 2024-01-28 DIAGNOSIS — R001 Bradycardia, unspecified: Secondary | ICD-10-CM

## 2024-01-28 DIAGNOSIS — I493 Ventricular premature depolarization: Secondary | ICD-10-CM | POA: Diagnosis not present
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
## 2024-01-28 NOTE — Patient Instructions (Signed)
 Medication Instructions:  Your physician recommends that you continue on your current medications as directed. Please refer to the Current Medication list given to you today.  *If you need a refill on your cardiac medications before your next appointment, please call your pharmacy*  Lab Work: NONE ordered at this time of appointment    Testing/Procedures: NONE ordered at this time of appointment    Follow-Up: At [facility], you and your health needs are our priority.  As part of our continuing mission to provide you with exceptional heart care, our providers are all part of one team.  This team includes your primary Cardiologist (physician) and Advanced Practice Providers or APPs (Physician Assistants and Nurse Practitioners) who all work together to provide you with the care you need, when you need it.  Your next appointment:   November 2025   Provider:   Jerel Balding, MD    We recommend signing up for the patient portal called MyChart.  Sign up information is provided on this After Visit Summary.  MyChart is used to connect with patients for Virtual Visits (Telemedicine).  Patients are able to view lab/test results, encounter notes, upcoming appointments, etc.  Non-urgent messages can be sent to your provider as well.   To learn more about what you can do with MyChart, go to [email or web address].   Other Instructions Monitor blood pressure. Report blood pressure. Report blood pressure consistently greater than 140/80.

## 2024-02-01 ENCOUNTER — Encounter: Payer: Self-pay | Admitting: Nurse Practitioner

## 2024-03-18 ENCOUNTER — Ambulatory Visit: Admitting: Cardiology

## 2024-04-13 DIAGNOSIS — Z125 Encounter for screening for malignant neoplasm of prostate: Secondary | ICD-10-CM | POA: Diagnosis not present

## 2024-04-13 DIAGNOSIS — I10 Essential (primary) hypertension: Secondary | ICD-10-CM | POA: Diagnosis not present

## 2024-04-13 DIAGNOSIS — Z1212 Encounter for screening for malignant neoplasm of rectum: Secondary | ICD-10-CM | POA: Diagnosis not present

## 2024-04-13 DIAGNOSIS — D649 Anemia, unspecified: Secondary | ICD-10-CM | POA: Diagnosis not present

## 2024-04-13 DIAGNOSIS — E785 Hyperlipidemia, unspecified: Secondary | ICD-10-CM | POA: Diagnosis not present

## 2024-04-13 DIAGNOSIS — R7301 Impaired fasting glucose: Secondary | ICD-10-CM | POA: Diagnosis not present

## 2024-04-20 DIAGNOSIS — Z1331 Encounter for screening for depression: Secondary | ICD-10-CM | POA: Diagnosis not present

## 2024-04-20 DIAGNOSIS — I10 Essential (primary) hypertension: Secondary | ICD-10-CM | POA: Diagnosis not present

## 2024-04-20 DIAGNOSIS — R7301 Impaired fasting glucose: Secondary | ICD-10-CM | POA: Diagnosis not present

## 2024-04-20 DIAGNOSIS — E039 Hypothyroidism, unspecified: Secondary | ICD-10-CM | POA: Diagnosis not present

## 2024-04-20 DIAGNOSIS — Z0000 Encounter for general adult medical examination without abnormal findings: Secondary | ICD-10-CM | POA: Diagnosis not present

## 2024-04-20 DIAGNOSIS — R972 Elevated prostate specific antigen [PSA]: Secondary | ICD-10-CM | POA: Diagnosis not present

## 2024-04-20 DIAGNOSIS — E785 Hyperlipidemia, unspecified: Secondary | ICD-10-CM | POA: Diagnosis not present

## 2024-04-20 DIAGNOSIS — Z952 Presence of prosthetic heart valve: Secondary | ICD-10-CM | POA: Diagnosis not present

## 2024-04-20 DIAGNOSIS — R82998 Other abnormal findings in urine: Secondary | ICD-10-CM | POA: Diagnosis not present

## 2024-05-01 ENCOUNTER — Other Ambulatory Visit: Payer: Self-pay | Admitting: Cardiovascular Disease

## 2024-05-13 DIAGNOSIS — N529 Male erectile dysfunction, unspecified: Secondary | ICD-10-CM | POA: Diagnosis not present

## 2024-05-13 DIAGNOSIS — K5900 Constipation, unspecified: Secondary | ICD-10-CM | POA: Diagnosis not present

## 2024-05-13 DIAGNOSIS — E785 Hyperlipidemia, unspecified: Secondary | ICD-10-CM | POA: Diagnosis not present

## 2024-05-13 DIAGNOSIS — I10 Essential (primary) hypertension: Secondary | ICD-10-CM | POA: Diagnosis not present

## 2024-05-13 DIAGNOSIS — I2510 Atherosclerotic heart disease of native coronary artery without angina pectoris: Secondary | ICD-10-CM | POA: Diagnosis not present

## 2024-05-13 DIAGNOSIS — E876 Hypokalemia: Secondary | ICD-10-CM | POA: Diagnosis not present

## 2024-05-13 DIAGNOSIS — Z8249 Family history of ischemic heart disease and other diseases of the circulatory system: Secondary | ICD-10-CM | POA: Diagnosis not present

## 2024-05-13 DIAGNOSIS — Z833 Family history of diabetes mellitus: Secondary | ICD-10-CM | POA: Diagnosis not present

## 2024-05-13 DIAGNOSIS — I700 Atherosclerosis of aorta: Secondary | ICD-10-CM | POA: Diagnosis not present

## 2024-05-31 ENCOUNTER — Ambulatory Visit: Attending: Cardiovascular Disease | Admitting: Cardiovascular Disease

## 2024-05-31 ENCOUNTER — Encounter: Payer: Self-pay | Admitting: Cardiovascular Disease

## 2024-05-31 VITALS — BP 190/58 | HR 76 | Ht 67.0 in | Wt 253.0 lb

## 2024-05-31 DIAGNOSIS — Z952 Presence of prosthetic heart valve: Secondary | ICD-10-CM

## 2024-05-31 DIAGNOSIS — I10 Essential (primary) hypertension: Secondary | ICD-10-CM

## 2024-05-31 DIAGNOSIS — I493 Ventricular premature depolarization: Secondary | ICD-10-CM

## 2024-05-31 DIAGNOSIS — R001 Bradycardia, unspecified: Secondary | ICD-10-CM | POA: Diagnosis not present

## 2024-05-31 DIAGNOSIS — E785 Hyperlipidemia, unspecified: Secondary | ICD-10-CM

## 2024-05-31 NOTE — Patient Instructions (Signed)
 Medication Instructions:  No changes *If you need a refill on your cardiac medications before your next appointment, please call your pharmacy*  Lab Work: None ordered If you have labs (blood work) drawn today and your tests are completely normal, you will receive your results only by: MyChart Message (if you have MyChart) OR A paper copy in the mail If you have any lab test that is abnormal or we need to change your treatment, we will call you to review the results.  Testing/Procedures: Your physician has requested that you have an echocardiogram end of December 2025. Echocardiography is a painless test that uses sound waves to create images of your heart. It provides your doctor with information about the size and shape of your heart and how well your heart's chambers and valves are working. This procedure takes approximately one hour. There are no restrictions for this procedure. Please do NOT wear cologne, perfume, aftershave, or lotions (deodorant is allowed). Please arrive 15 minutes prior to your appointment time.  Please note: We ask at that you not bring children with you during ultrasound (echo/ vascular) testing. Due to room size and safety concerns, children are not allowed in the ultrasound rooms during exams. Our front office staff cannot provide observation of children in our lobby area while testing is being conducted. An adult accompanying a patient to their appointment will only be allowed in the ultrasound room at the discretion of the ultrasound technician under special circumstances. We apologize for any inconvenience.   Follow-Up: At [facility], you and your health needs are our priority.  As part of our continuing mission to provide you with exceptional heart care, our providers are all part of one team.  This team includes your primary Cardiologist (physician) and Advanced Practice Providers or APPs (Physician Assistants and Nurse Practitioners) who all work  together to provide you with the care you need, when you need it.  Your next appointment:   1 year(s)  Provider:   Jerel Balding, MD    We recommend signing up for the patient portal called MyChart.  Sign up information is provided on this After Visit Summary.  MyChart is used to connect with patients for Virtual Visits (Telemedicine).  Patients are able to view lab/test results, encounter notes, upcoming appointments, etc.  Non-urgent messages can be sent to your provider as well.   To learn more about what you can do with MyChart, go to [email or web address].

## 2024-06-04 ENCOUNTER — Encounter: Payer: Self-pay | Admitting: Cardiovascular Disease
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
## 2024-06-29 ENCOUNTER — Ambulatory Visit: Payer: Self-pay | Admitting: Cardiovascular Disease

## 2024-06-29 ENCOUNTER — Ambulatory Visit (HOSPITAL_COMMUNITY)
Admission: RE | Admit: 2024-06-29 | Discharge: 2024-06-29 | Disposition: A | Source: Ambulatory Visit | Attending: Cardiology

## 2024-06-29 DIAGNOSIS — Z952 Presence of prosthetic heart valve: Secondary | ICD-10-CM | POA: Insufficient documentation

## 2024-06-29 LAB — ECHOCARDIOGRAM COMPLETE
AR max vel: 1.84 cm2
AV Area VTI: 1.43 cm2
AV Area mean vel: 1.7 cm2
AV Mean grad: 17.5 mmHg
AV Peak grad: 34.8 mmHg
Ao pk vel: 2.95 m/s
Area-P 1/2: 3.05 cm2
S' Lateral: 3.13 cm
